# Patient Record
Sex: Female | Born: 1970 | Race: Black or African American | Hispanic: No | Marital: Single | Smoking: Never smoker
Health system: Southern US, Community
[De-identification: ages and names within clinical notes are randomized; demographics above are authoritative.]

## PROBLEM LIST (undated history)

## (undated) DIAGNOSIS — I10 Essential (primary) hypertension: Secondary | ICD-10-CM

## (undated) DIAGNOSIS — R87619 Unspecified abnormal cytological findings in specimens from cervix uteri: Secondary | ICD-10-CM

## (undated) DIAGNOSIS — N649 Disorder of breast, unspecified: Secondary | ICD-10-CM

## (undated) DIAGNOSIS — D649 Anemia, unspecified: Secondary | ICD-10-CM

## (undated) DIAGNOSIS — R51 Headache: Secondary | ICD-10-CM

## (undated) DIAGNOSIS — N83202 Unspecified ovarian cyst, left side: Secondary | ICD-10-CM

## (undated) DIAGNOSIS — Z862 Personal history of diseases of the blood and blood-forming organs and certain disorders involving the immune mechanism: Secondary | ICD-10-CM

## (undated) DIAGNOSIS — F329 Major depressive disorder, single episode, unspecified: Secondary | ICD-10-CM

## (undated) DIAGNOSIS — T7840XA Allergy, unspecified, initial encounter: Secondary | ICD-10-CM

## (undated) DIAGNOSIS — J310 Chronic rhinitis: Secondary | ICD-10-CM

## (undated) DIAGNOSIS — IMO0002 Reserved for concepts with insufficient information to code with codable children: Secondary | ICD-10-CM

## (undated) DIAGNOSIS — Z8669 Personal history of other diseases of the nervous system and sense organs: Secondary | ICD-10-CM

## (undated) DIAGNOSIS — Z8619 Personal history of other infectious and parasitic diseases: Secondary | ICD-10-CM

## (undated) DIAGNOSIS — D759 Disease of blood and blood-forming organs, unspecified: Secondary | ICD-10-CM

## (undated) DIAGNOSIS — O26899 Other specified pregnancy related conditions, unspecified trimester: Secondary | ICD-10-CM

## (undated) DIAGNOSIS — G4730 Sleep apnea, unspecified: Secondary | ICD-10-CM

## (undated) DIAGNOSIS — S42309A Unspecified fracture of shaft of humerus, unspecified arm, initial encounter for closed fracture: Secondary | ICD-10-CM

## (undated) DIAGNOSIS — I8390 Asymptomatic varicose veins of unspecified lower extremity: Secondary | ICD-10-CM

## (undated) DIAGNOSIS — Z9889 Other specified postprocedural states: Secondary | ICD-10-CM

## (undated) DIAGNOSIS — F32A Depression, unspecified: Secondary | ICD-10-CM

## (undated) DIAGNOSIS — B977 Papillomavirus as the cause of diseases classified elsewhere: Secondary | ICD-10-CM

## (undated) DIAGNOSIS — R102 Pelvic and perineal pain: Secondary | ICD-10-CM

## (undated) HISTORY — DX: Reserved for concepts with insufficient information to code with codable children: IMO0002

## (undated) HISTORY — DX: Major depressive disorder, single episode, unspecified: F32.9

## (undated) HISTORY — DX: Other specified postprocedural states: Z98.890

## (undated) HISTORY — DX: Pelvic and perineal pain: R10.2

## (undated) HISTORY — DX: Unspecified fracture of shaft of humerus, unspecified arm, initial encounter for closed fracture: S42.309A

## (undated) HISTORY — PX: HERNIA REPAIR: SHX51

## (undated) HISTORY — DX: Depression, unspecified: F32.A

## (undated) HISTORY — DX: Sleep apnea, unspecified: G47.30

## (undated) HISTORY — DX: Disorder of breast, unspecified: N64.9

## (undated) HISTORY — DX: Unspecified ovarian cyst, left side: N83.202

## (undated) HISTORY — DX: Other specified pregnancy related conditions, unspecified trimester: O26.899

## (undated) HISTORY — DX: Personal history of diseases of the blood and blood-forming organs and certain disorders involving the immune mechanism: Z86.2

## (undated) HISTORY — DX: Personal history of other infectious and parasitic diseases: Z86.19

## (undated) HISTORY — DX: Chronic rhinitis: J31.0

## (undated) HISTORY — DX: Personal history of other diseases of the nervous system and sense organs: Z86.69

## (undated) HISTORY — DX: Papillomavirus as the cause of diseases classified elsewhere: B97.7

## (undated) HISTORY — DX: Allergy, unspecified, initial encounter: T78.40XA

## (undated) HISTORY — DX: Asymptomatic varicose veins of unspecified lower extremity: I83.90

---

## 2005-04-05 ENCOUNTER — Emergency Department: Payer: Self-pay | Admitting: Emergency Medicine

## 2005-07-10 ENCOUNTER — Emergency Department: Payer: Self-pay | Admitting: Emergency Medicine

## 2006-09-25 DIAGNOSIS — K602 Anal fissure, unspecified: Secondary | ICD-10-CM | POA: Insufficient documentation

## 2006-09-25 HISTORY — PX: ANAL FISSURE REPAIR: SHX2312

## 2006-09-25 HISTORY — PX: EXCISIONAL HEMORRHOIDECTOMY: SHX1541

## 2010-05-18 ENCOUNTER — Emergency Department: Payer: Self-pay | Admitting: Emergency Medicine

## 2010-11-07 ENCOUNTER — Ambulatory Visit: Payer: Self-pay | Admitting: Internal Medicine

## 2010-11-07 DIAGNOSIS — R760 Raised antibody titer: Secondary | ICD-10-CM | POA: Insufficient documentation

## 2010-11-24 ENCOUNTER — Ambulatory Visit: Payer: Self-pay | Admitting: Internal Medicine

## 2010-12-25 ENCOUNTER — Ambulatory Visit: Payer: Self-pay | Admitting: Internal Medicine

## 2011-01-24 ENCOUNTER — Ambulatory Visit: Payer: Self-pay | Admitting: Internal Medicine

## 2011-02-24 ENCOUNTER — Ambulatory Visit: Payer: Self-pay | Admitting: Internal Medicine

## 2011-03-02 ENCOUNTER — Other Ambulatory Visit: Payer: Self-pay | Admitting: Obstetrics and Gynecology

## 2011-03-02 DIAGNOSIS — N644 Mastodynia: Secondary | ICD-10-CM

## 2011-03-09 ENCOUNTER — Ambulatory Visit
Admission: RE | Admit: 2011-03-09 | Discharge: 2011-03-09 | Disposition: A | Discharge status: Home / Self Care | Payer: BC Managed Care – PPO | Source: Ambulatory Visit | Attending: Obstetrics and Gynecology | Admitting: Obstetrics and Gynecology

## 2011-03-09 DIAGNOSIS — N644 Mastodynia: Secondary | ICD-10-CM

## 2011-03-10 ENCOUNTER — Other Ambulatory Visit: Payer: Self-pay

## 2011-03-13 DIAGNOSIS — Z9889 Other specified postprocedural states: Secondary | ICD-10-CM

## 2011-03-13 HISTORY — DX: Other specified postprocedural states: Z98.890

## 2011-03-26 DIAGNOSIS — S42309A Unspecified fracture of shaft of humerus, unspecified arm, initial encounter for closed fracture: Secondary | ICD-10-CM

## 2011-03-26 HISTORY — DX: Unspecified fracture of shaft of humerus, unspecified arm, initial encounter for closed fracture: S42.309A

## 2011-04-01 DIAGNOSIS — S42309A Unspecified fracture of shaft of humerus, unspecified arm, initial encounter for closed fracture: Secondary | ICD-10-CM | POA: Insufficient documentation

## 2011-04-02 ENCOUNTER — Emergency Department (HOSPITAL_COMMUNITY)
Admission: EM | Admit: 2011-04-02 | Discharge: 2011-04-02 | Disposition: A | Discharge status: Home / Self Care | Payer: BC Managed Care – PPO | Attending: Emergency Medicine | Admitting: Emergency Medicine

## 2011-04-02 ENCOUNTER — Emergency Department (HOSPITAL_COMMUNITY): Payer: BC Managed Care – PPO

## 2011-04-02 DIAGNOSIS — S42309A Unspecified fracture of shaft of humerus, unspecified arm, initial encounter for closed fracture: Secondary | ICD-10-CM | POA: Insufficient documentation

## 2011-04-02 DIAGNOSIS — Z4689 Encounter for fitting and adjustment of other specified devices: Secondary | ICD-10-CM | POA: Insufficient documentation

## 2011-04-02 DIAGNOSIS — M25519 Pain in unspecified shoulder: Secondary | ICD-10-CM | POA: Insufficient documentation

## 2011-04-07 ENCOUNTER — Encounter (HOSPITAL_COMMUNITY): Payer: Self-pay

## 2011-04-07 ENCOUNTER — Ambulatory Visit (HOSPITAL_COMMUNITY)
Admission: RE | Admit: 2011-04-07 | Discharge: 2011-04-07 | Disposition: A | Discharge status: Home / Self Care | Payer: BC Managed Care – PPO | Source: Ambulatory Visit | Attending: Obstetrics and Gynecology | Admitting: Obstetrics and Gynecology

## 2011-04-07 DIAGNOSIS — R760 Raised antibody titer: Secondary | ICD-10-CM

## 2011-04-07 DIAGNOSIS — S42301A Unspecified fracture of shaft of humerus, right arm, initial encounter for closed fracture: Secondary | ICD-10-CM

## 2011-04-07 DIAGNOSIS — D649 Anemia, unspecified: Secondary | ICD-10-CM | POA: Insufficient documentation

## 2011-04-07 DIAGNOSIS — O99011 Anemia complicating pregnancy, first trimester: Secondary | ICD-10-CM

## 2011-04-07 DIAGNOSIS — D759 Disease of blood and blood-forming organs, unspecified: Secondary | ICD-10-CM

## 2011-04-07 DIAGNOSIS — D691 Qualitative platelet defects: Secondary | ICD-10-CM | POA: Insufficient documentation

## 2011-04-07 HISTORY — DX: Disease of blood and blood-forming organs, unspecified: D75.9

## 2011-04-07 NOTE — Assessment & Plan Note (Signed)
Patient is making appointment with hematologist for further assessment of abnormal bleeding times and normal Von Willebrand factor.

## 2011-04-07 NOTE — Assessment & Plan Note (Signed)
Positive lupus anticoagulant study in February, not further assessed for anticardiolipin and b2 glycoprotein 1.  Should be assessed at next heme-onc appointment.

## 2011-04-07 NOTE — Progress Notes (Signed)
Desiree [name] [name] is Desiree [name] 41 y.o. female prima gravida to assess for recent lab abnormalities and need for management in pregnancy. Outside hematology reports reviewed; + lupus anticoagulant without sufficient documentation of anticadiolipin abnormality.  Also with prolonged bleeding times and normal Von Willebrand assessment.  Has not returned to specialist yet but plans revisit to further determine her hematological status.  No evidence to support lupus.  Family history includes stroke and venous thrombosis in her mother, both treated with anticoagulation.  Anemia is long standing and has apparently been corrected with replacement therapy  Current Outpatient Prescriptions  Medication Sig Dispense Refill  . IRON COMBINATIONS PO Take by mouth.        . Nutritional Supplements (VITAMIN D MAINTENANCE PO) Take by mouth.        . oxyCODONE-acetaminophen (PERCOCET) 10-325 MG per tablet Take 1 tablet by mouth every 4 (four) hours as needed.        . Prenatal MV-Min-Fe Fum-FA-DHA (PRENATAL 1 PO) Take by mouth.        . Probiotic Product (SOLUBLE FIBER/PROBIOTICS PO) Take by mouth.          Active Problems:  * No active hospital problems. *   Blood pressure 120/57, pulse 60, weight 138 lb (62.596 kg), last menstrual period 03/13/2011.  Review of Systems  Constitutional: Negative.   HENT: Negative.   Eyes: Negative for photophobia.  Respiratory: Negative.   Cardiovascular: Negative.   Gastrointestinal: Negative.   Genitourinary: Negative.   Musculoskeletal: Negative for joint pain.  Skin: Negative for rash.       bruising  Neurological: Negative.   Endo/Heme/Allergies: Bruises/bleeds easily.  Psychiatric/Behavioral: Negative.     Physical Exam  Vitals reviewed. Constitutional: She is oriented to person, place, and time. She appears well-developed and well-nourished.  Musculoskeletal: She exhibits no edema.  Neurological: She is alert and oriented to person, place, and time.  Skin: Skin is warm.      Assessment: see assessments and plans.  In absence of family history of known thrombophilias, acquired or inherited, and lack of features associated with antiphospholipid syndrome, Desiree [name] [name] needs further assessment of what may be Desiree [name] [name] with normal platelet count.  Despite Desiree [name] [name], she has not had any blood clots.  She is not presently Desiree [name] [name] for prophylactic anticoagulation, pending further studies.  Re-consultation after these are completed may be appropriate.   Time of face to face consultation: 30 minutes. Thank you for the opportunity to consult with Desiree [name] [name].  Desiree [name] [name],JOE 04/07/2011

## 2011-04-07 NOTE — Assessment & Plan Note (Signed)
Care with local orthopedist

## 2011-04-07 NOTE — Assessment & Plan Note (Signed)
Patient is receiving combination iron and Vitamin D for long-standing history of anemia associated with heavy periods and post surgical bleed, easy bruisability.

## 2011-04-14 ENCOUNTER — Ambulatory Visit: Payer: Self-pay | Admitting: Internal Medicine

## 2011-04-26 ENCOUNTER — Ambulatory Visit: Payer: Self-pay | Admitting: Internal Medicine

## 2011-05-04 LAB — ABO/RH: RH Type: POSITIVE

## 2011-05-04 LAB — GC/CHLAMYDIA PROBE AMP, GENITAL

## 2011-05-04 LAB — HIV ANTIBODY (ROUTINE TESTING W REFLEX): HIV: NONREACTIVE

## 2011-06-15 ENCOUNTER — Other Ambulatory Visit: Payer: Self-pay

## 2011-11-22 ENCOUNTER — Encounter (INDEPENDENT_AMBULATORY_CARE_PROVIDER_SITE_OTHER): Payer: BC Managed Care – PPO | Admitting: Obstetrics and Gynecology

## 2011-11-22 DIAGNOSIS — Z331 Pregnant state, incidental: Secondary | ICD-10-CM

## 2011-11-25 ENCOUNTER — Encounter (HOSPITAL_COMMUNITY): Payer: Self-pay

## 2011-11-25 ENCOUNTER — Inpatient Hospital Stay (HOSPITAL_COMMUNITY)
Admission: AD | Admit: 2011-11-25 | Discharge: 2011-11-27 | DRG: 373 | Disposition: A | Discharge status: Home / Self Care | Payer: BC Managed Care – PPO | Attending: Obstetrics and Gynecology | Admitting: Obstetrics and Gynecology

## 2011-11-25 ENCOUNTER — Telehealth: Payer: Self-pay | Admitting: Obstetrics and Gynecology

## 2011-11-25 DIAGNOSIS — O09519 Supervision of elderly primigravida, unspecified trimester: Secondary | ICD-10-CM | POA: Diagnosis present

## 2011-11-25 DIAGNOSIS — IMO0001 Reserved for inherently not codable concepts without codable children: Secondary | ICD-10-CM

## 2011-11-25 HISTORY — DX: Anemia, unspecified: D64.9

## 2011-11-25 HISTORY — DX: Headache: R51

## 2011-11-25 HISTORY — DX: Reserved for concepts with insufficient information to code with codable children: IMO0002

## 2011-11-25 HISTORY — DX: Unspecified abnormal cytological findings in specimens from cervix uteri: R87.619

## 2011-11-25 LAB — CBC
HCT: 38.2 % (ref 36.0–46.0)
Hemoglobin: 12.5 g/dL (ref 12.0–15.0)
MCH: 28 pg (ref 26.0–34.0)
MCHC: 32.7 g/dL (ref 30.0–36.0)
MCV: 85.5 fL (ref 78.0–100.0)
Platelets: 226 10*3/uL (ref 150–400)
RBC: 4.47 MIL/uL (ref 3.87–5.11)
RDW: 13.8 % (ref 11.5–15.5)
WBC: 7.6 10*3/uL (ref 4.0–10.5)

## 2011-11-25 MED ORDER — ACETAMINOPHEN 325 MG PO TABS: 650.0000 mg | ORAL_TABLET | ORAL | Status: DC | PRN

## 2011-11-25 MED ORDER — SODIUM CHLORIDE 0.9 % IJ SOLN: 3.0000 mL | INTRAMUSCULAR | Status: DC | PRN

## 2011-11-25 MED ORDER — BUTORPHANOL TARTRATE 2 MG/ML IJ SOLN: 1.0000 mg | INTRAMUSCULAR | Status: DC | PRN

## 2011-11-25 MED ORDER — IBUPROFEN 600 MG PO TABS
600.0000 mg | ORAL_TABLET | Freq: Four times a day (QID) | ORAL | Status: DC | PRN
  Administered 2011-11-25: 600 mg via ORAL
  Filled 2011-11-25: qty 1

## 2011-11-25 MED ORDER — MISOPROSTOL 200 MCG PO TABS
ORAL_TABLET | ORAL | Status: AC
  Administered 2011-11-25: 800 ug via VAGINAL
  Filled 2011-11-25: qty 4

## 2011-11-25 MED ORDER — OXYCODONE-ACETAMINOPHEN 5-325 MG PO TABS: 1.0000 | ORAL_TABLET | ORAL | Status: DC | PRN

## 2011-11-25 MED ORDER — CITRIC ACID-SODIUM CITRATE 334-500 MG/5ML PO SOLN: 30.0000 mL | ORAL | Status: DC | PRN

## 2011-11-25 MED ORDER — SODIUM CHLORIDE 0.9 % IV SOLN: 250.0000 mL | INTRAVENOUS | Status: DC | PRN

## 2011-11-25 MED ORDER — FLEET ENEMA 7-19 GM/118ML RE ENEM: 1.0000 | ENEMA | RECTAL | Status: DC | PRN

## 2011-11-25 MED ORDER — OXYTOCIN 10 UNIT/ML IJ SOLN
INTRAMUSCULAR | Status: AC
  Administered 2011-11-25: 20 [IU] via INTRAMUSCULAR
  Filled 2011-11-25: qty 2

## 2011-11-25 MED ORDER — LIDOCAINE HCL (PF) 1 % IJ SOLN
30.0000 mL | INTRAMUSCULAR | Status: DC | PRN
  Administered 2011-11-25: 30 mL via SUBCUTANEOUS
  Filled 2011-11-25: qty 30

## 2011-11-25 MED ORDER — ONDANSETRON HCL 4 MG/2ML IJ SOLN: 4.0000 mg | Freq: Four times a day (QID) | INTRAMUSCULAR | Status: DC | PRN

## 2011-11-25 MED ORDER — OXYTOCIN BOLUS FROM INFUSION
500.0000 mL | Freq: Once | INTRAVENOUS | Status: DC
  Filled 2011-11-25: qty 500
  Filled 2011-11-25: qty 1000

## 2011-11-25 MED ORDER — OXYTOCIN 20 UNITS IN LACTATED RINGERS INFUSION - SIMPLE: 125.0000 mL/h | Freq: Once | INTRAVENOUS | Status: DC

## 2011-11-25 MED ORDER — SODIUM CHLORIDE 0.9 % IJ SOLN: 3.0000 mL | Freq: Two times a day (BID) | INTRAMUSCULAR | Status: DC

## 2011-11-25 MED ORDER — LACTATED RINGERS IV SOLN: 500.0000 mL | INTRAVENOUS | Status: DC | PRN

## 2011-11-25 NOTE — Progress Notes (Signed)
SVD of viable female 

## 2011-11-25 NOTE — Plan of Care (Signed)
Problem: Consults Goal: Birthing Suites Patient Information Press F2 to bring up selections list   Pt > [redacted] weeks EGA     

## 2011-11-25 NOTE — Progress Notes (Signed)
Pt assisted to all fours in bed, but finds it uncomfortable. Pt asked about bathtub. Offered shower and offered an empty room to use tub-pt indecisive at this point due to pain.

## 2011-11-25 NOTE — Progress Notes (Signed)
Desiree [name] is a 41 y.o. G1P0000 at [redacted]w[redacted]d  admitted for active labor  Subjective: Pt continues to change positions frequently and ambulate intermittently.  She continues to do well with UCs.  She states she is coping well but having increased pelvic and vaginal pressure with UCs.  Has urge to push with UCs.  Family members remain at bedside and supporting pt.    Objective: BP 131/74  Pulse 79  Temp(Src) 98.6 F (37 C) (Oral)  Resp 20  Ht 5\' 3"  (1.6 m)  Wt 77.565 kg (171 lb)  BMI 30.29 kg/m2  LMP 03/13/2011 I/O last 3 completed shifts: In: -  Out: 650 [Emesis/NG output:650]   FHT:  FHR: 130 bpm, variability: moderate,  accelerations:  Present,  decelerations:  Absent UC:   regular, every 1-2 minutes SVE:   Dilation: 10 Effacement (%): 100 Station: +2 Exam by:: Elsie Ra CNM  Labs: Lab Results  Component Value Date   WBC 7.6 11/25/2011   HGB 12.5 11/25/2011   HCT 38.2 11/25/2011   MCV 85.5 11/25/2011   PLT 226 11/25/2011    Assessment / Plan: Spontaneous labor, progressing normally  Labor: Progressing normally Preeclampsia:  no signs or symptoms of toxicity Fetal Wellbeing:  Category I Pain Control:  Labor support without medications I/D:  n/a Anticipated MOD:  NSVD  Pt to begin pushing with urge.  Continue current plan and observe carefully.    Davita Sublett O. 11/25/2011, 8:34 PM

## 2011-11-26 LAB — CBC
Hemoglobin: 11.5 g/dL — ABNORMAL LOW (ref 12.0–15.0)
MCH: 27.7 pg (ref 26.0–34.0)
Platelets: 220 10*3/uL (ref 150–400)
RBC: 4.15 MIL/uL (ref 3.87–5.11)
WBC: 18.2 10*3/uL — ABNORMAL HIGH (ref 4.0–10.5)

## 2011-11-26 MED ORDER — ONDANSETRON HCL 4 MG PO TABS: 4.0000 mg | ORAL_TABLET | ORAL | Status: DC | PRN

## 2011-11-26 MED ORDER — SENNOSIDES-DOCUSATE SODIUM 8.6-50 MG PO TABS
2.0000 | ORAL_TABLET | Freq: Every day | ORAL | Status: DC
Start: 2011-11-26 — End: 2011-11-27
  Administered 2011-11-26: 2 via ORAL

## 2011-11-26 MED ORDER — ZOLPIDEM TARTRATE 5 MG PO TABS: 5.0000 mg | ORAL_TABLET | Freq: Every evening | ORAL | Status: DC | PRN

## 2011-11-26 MED ORDER — SIMETHICONE 80 MG PO CHEW: 80.0000 mg | CHEWABLE_TABLET | ORAL | Status: DC | PRN

## 2011-11-26 MED ORDER — BENZOCAINE-MENTHOL 20-0.5 % EX AERO
INHALATION_SPRAY | CUTANEOUS | Status: AC
  Filled 2011-11-26: qty 56

## 2011-11-26 MED ORDER — DIPHENHYDRAMINE HCL 25 MG PO CAPS: 25.0000 mg | ORAL_CAPSULE | Freq: Four times a day (QID) | ORAL | Status: DC | PRN

## 2011-11-26 MED ORDER — WITCH HAZEL-GLYCERIN EX PADS: 1.0000 "application " | MEDICATED_PAD | CUTANEOUS | Status: DC | PRN

## 2011-11-26 MED ORDER — OXYCODONE-ACETAMINOPHEN 5-325 MG PO TABS: 1.0000 | ORAL_TABLET | ORAL | Status: DC | PRN

## 2011-11-26 MED ORDER — FERROUS SULFATE 325 (65 FE) MG PO TABS
325.0000 mg | ORAL_TABLET | Freq: Two times a day (BID) | ORAL | Status: DC
  Administered 2011-11-26 – 2011-11-27 (×3): 325 mg via ORAL
  Filled 2011-11-26 (×3): qty 1

## 2011-11-26 MED ORDER — TETANUS-DIPHTH-ACELL PERTUSSIS 5-2.5-18.5 LF-MCG/0.5 IM SUSP
0.5000 mL | Freq: Once | INTRAMUSCULAR | Status: AC
  Administered 2011-11-26: 0.5 mL via INTRAMUSCULAR
  Filled 2011-11-26: qty 0.5

## 2011-11-26 MED ORDER — BENZOCAINE-MENTHOL 20-0.5 % EX AERO
1.0000 "application " | INHALATION_SPRAY | CUTANEOUS | Status: DC | PRN
  Administered 2011-11-26: 1 via TOPICAL

## 2011-11-26 MED ORDER — DIBUCAINE 1 % RE OINT: 1.0000 "application " | TOPICAL_OINTMENT | RECTAL | Status: DC | PRN

## 2011-11-26 MED ORDER — LANOLIN HYDROUS EX OINT: TOPICAL_OINTMENT | CUTANEOUS | Status: DC | PRN

## 2011-11-26 MED ORDER — IBUPROFEN 600 MG PO TABS
600.0000 mg | ORAL_TABLET | Freq: Four times a day (QID) | ORAL | Status: DC
  Administered 2011-11-26 – 2011-11-27 (×6): 600 mg via ORAL
  Filled 2011-11-26 (×6): qty 1

## 2011-11-26 MED ORDER — ONDANSETRON HCL 4 MG/2ML IJ SOLN: 4.0000 mg | INTRAMUSCULAR | Status: DC | PRN

## 2011-11-26 MED ORDER — PRENATAL MULTIVITAMIN CH
1.0000 | ORAL_TABLET | Freq: Every day | ORAL | Status: DC
  Administered 2011-11-26 – 2011-11-27 (×2): 1 via ORAL
  Filled 2011-11-26 (×2): qty 1

## 2011-11-26 NOTE — Progress Notes (Signed)
Transferred to room 109 via wheelchair wit hfamily at side

## 2011-11-26 NOTE — Progress Notes (Signed)
Delivery Note Pt pushed effectively with UCs.  At 9:34 PM a viable female was delivered via Vaginal, Spontaneous Delivery (Presentation: Right Occiput Anterior). No nuchal cord noted.  No difficulty with shoulders.  Infant with spontaneous lusty cry.  Infant dried, stimulated and placed on maternal abdomen.  Cord doubly clamped.  Cord cut by pt's mother at bedside.     APGAR: 9, ; weight 8 lb 2.3 oz (3695 g).   Placenta status: Intact, Spontaneous.  Cord: 3 vessels with the following complications: None.  Cord pH: Not obtained  Anesthesia: 1% Xylocaine for laceration repair  Episiotomy: None Lacerations: 2nd degree;Perineal Suture Repair: 3.0 vicryl Est. Blood Loss (mL): 450  Mom to postpartum.  Baby to nursery-stable.  Keshawna Dix O. 11/26/2011, 12:53 PM
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
## 2011-11-26 NOTE — Progress Notes (Addendum)
Desiree [name] is a 41 y.o. G1P1001 at [redacted]w[redacted]d admitted for active labor  Subjective: Pt reports increased intensity and frequency of contractions.  She continues to ambulate in room.  Family members present for support.  Pt states she is coping without difficulty and desires to continue with the same management.  She does report some increased vaginal pressure and requests SVE.    Objective: Blood pressure 133/74; pulse 99; resp 24; temperature 97.9. Pt breathing and swaying with contractions.  Coping well.    FHT:  FHR: 130 bpm, variability: moderate,  accelerations:  Present,  decelerations:  Absent UC:   regular, every 1-2 minutes SVE:   Dilation: 7cm Effacement (%) 80 Station: -3 Exam by: Elsie Ra, CNM     SROM after exam with moderate amt light meconium stained amniotic fluid noted.       Assessment / Plan: Spontaneous labor, progressing normally Meconium stained amniotic fluid  Labor: Progressing normally Preeclampsia:  no signs or symptoms of toxicity Fetal Wellbeing:  Category I Pain Control:  Labor support without medications I/D:  n/a Anticipated MOD:  NSVD  Continue current plan of care.   Doryan Bahl O. 11/26/2011, 12:27 PM

## 2011-11-26 NOTE — Progress Notes (Addendum)
Sierah Lacewell is a 41 y.o. G1P1001 at [redacted]w[redacted]d admitted for active labor   Subjective:  Pt reports increased intensity and frequency of contractions. She continues to ambulate in room. Family members present for support. Pt states she is coping without difficulty and desires to continue with the same management. She does report some increased vaginal pressure and requests SVE.   Objective:  Blood pressure 131/74; pulse 79; resp 24; temperature 98.6 Pt breathing and swaying with contractions. Coping well.  FHT: FHR: 135 bpm, variability: moderate, accelerations: Present, decelerations: Absent  UC: regular, every 1-2 minutes   SVE: Dilation: 8 to 9cm Effacement (%) 100 Station: -2 Exam by: Elsie Ra, CNM  Continues to leak small amts of light to mod meconium stained amniotic fluid.  Assessment / Plan:  Spontaneous labor, progressing normally   Labor: Progressing normally  Preeclampsia: no signs or symptoms of toxicity  Fetal Wellbeing: Category I  Pain Control: Labor support without medications  I/D: n/a  Anticipated MOD: NSVD   Continue current plan of care.

## 2011-11-26 NOTE — H&P (Signed)
Desiree [name] is a 41 y.o. female presenting for complaints of regular uterine contractions with increased frequency and intensity since 0500.  Denies ROM or bldg.  Reports normal fetal activity.    Maternal Medical History:  Reason for admission: Reason for admission: contractions.  Contractions: Onset was 6-12 hours ago.   Frequency: regular.   Perceived severity is strong.    Fetal activity: Perceived fetal activity is normal.   Last perceived fetal movement was within the past hour.    Prenatal complications: no prenatal complications Prenatal Complications - Diabetes: none.   Hx Present Pregnancy: Pt entered care at [redacted]wks gestation.  She was followed by the MD service at [facility].  Pt's hx significant for fx humerus during the 1st trimester with xray exposure as well as medications.  Fx followed by ortho and healed without issues. Pt had neg 1st trimester screening.  Pt was referred to [facility] due to reported hx of prolonged bleeding time and subsequent testing for coagulopathy was found to be neg.  Pt was also evaluated by MFM due to lab result of positive Lupus anticoagulant.  No anticoagulation was found to be necessary by MFM.  Pt underwent US for anatomy at 20wks.  Fetal growth was followed by [place] at 24 and 27wks.  The remainder of pt's prenatal course was uncomplicated.    OB History    Grav Para Term Preterm Abortions TAB SAB Ect Mult Living   1 1 1  0 0 0 0 0 0 1     Past Medical History  Diagnosis Date  . 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 Anemia   . Headache   . Abnormal Pap smear    Past Surgical History  Procedure Date  . Excisional hemorrhoidectomy 2008    Bleeding complication  . No past surgeries    Family History: family history includes Deep vein thrombosis in her mother; Lupus in her cousin; and Stroke in her mother. Social History:  reports that she has never smoked. She has never used smokeless tobacco. She  reports that she does not drink alcohol or use illicit drugs.  Review of Systems  Constitutional: Negative.   HENT: Negative.   Eyes: Negative.   Respiratory: Negative.   Cardiovascular: Negative.   Gastrointestinal: Negative.   Genitourinary: Negative.   Musculoskeletal: Negative.   Skin: Negative.   Neurological: Negative.   Endo/Heme/Allergies: Negative.   Psychiatric/Behavioral: Negative.    Dilation: 4-5cm Effacement %: 90 Station: -3; vertex, BBOW Exam by: N. Katrinka Blazing, CNM Blood pressure 133/77; pulse 92; resp 16; Height 5 ft 3 in; Weight 77.565kg  FHR baseline 130.  Moderate variability noted.  Accels present.  Decels absent.  FHR Cat I UC's every 3-4 minutes; 60-70 secs duration; mod to strong to palpation  Maternal Exam:  Uterine Assessment: Contraction strength is firm.  Contraction frequency is regular.   Abdomen: Patient reports no abdominal tenderness. Fundal height is 40.   Estimated fetal weight is 8.5.   Fetal presentation: vertex  Introitus: Normal vulva. Normal vagina.  Ferning test: not done.  Nitrazine test: not done. Amniotic fluid character: not assessed.  Pelvis: adequate for delivery.   Cervix: Cervix evaluated by digital exam.     Physical Exam  Constitutional: She is oriented to person, place, and time. She appears well-developed and well-nourished.  HENT:  Head: Normocephalic and atraumatic.  Right Ear: External ear normal.  Left Ear: External ear normal.  Nose:  Nose normal.  Eyes: Conjunctivae are normal. Pupils are equal, round, and reactive to light.  Neck: Normal range of motion. Neck supple. No thyromegaly present.  Cardiovascular: Normal rate, regular rhythm and intact distal pulses.   Respiratory: Effort normal and breath sounds normal.  GI: Soft. Bowel sounds are normal.  Genitourinary: Vagina normal and uterus normal.  Musculoskeletal: Normal range of motion. She exhibits edema.       Tr edema noted lower extrems.  Neurological:  She is alert and oriented to person, place, and time. She has normal reflexes.  Skin: Skin is warm and dry.  Psychiatric: She has a normal mood and affect. Her behavior is normal.    Prenatal labs: ABO, Rh: O/Positive/-- (08/09 0000) Antibody: Negative (08/09 0000) Rubella: Immune (08/09 0000) RPR: NON REACTIVE (03/02 1440)  HBsAg: Negative (08/09 0000)  HIV: Non-reactive (08/09 0000)  GBS: Negative (02/04 0000)   Assessment/Plan: IUP at 40w 0d Active labor Advanced maternal age  Admit to [facility] per consult with Dr. Stefano Gaul. Routine admission orders Pt desires non-interventive birth and will allow pt to ambulate and monitor FHR intermittently at present.    SMITH,NONA O. 11/26/2011, 10:55 AM

## 2011-11-27 MED ORDER — IBUPROFEN 600 MG PO TABS: 600.0000 mg | ORAL_TABLET | Freq: Four times a day (QID) | ORAL | Status: AC | PRN

## 2011-11-27 NOTE — Discharge Instructions (Signed)

## 2011-11-27 NOTE — Progress Notes (Signed)
Post Partum Day 1 Subjective: no complaints, up ad lib, voiding, tolerating PO, + flatus and working on Breastfeeding.  Plan IP circ, but questions r/e "cyst-like" areas x2 on head of newborn's penis.  Does report some lilght-headedness when up since delivery.  Several family members at bedside and supportive.  VB lighter today.  Objective: Blood pressure 120/78, pulse 91, temperature 98.4 F (36.9 C), temperature source Oral, resp. rate 20, height 5\' 3"  (1.6 m), weight 77.565 kg (171 lb), last menstrual period 03/13/2011, SpO2 99.00%, unknown if currently breastfeeding.  Physical Exam:  General: alert, cooperative and no distress Lochia: appropriate, rubra Uterine Fundus: firm, below umbilicus Incision: n/a DVT Evaluation: No evidence of DVT seen on physical exam. Negative Homan's sign. No significant calf/ankle edema.   Basename 11/26/11 0500 11/25/11 1440  HGB 11.5* 12.5  HCT 35.1* 38.2    Assessment/Plan: Plan for discharge tomorrow, Breastfeeding, Lactation consult and Circumcision prior to discharge Referred questions r/e newborn's penile exam to her pediatrician.  Rec'd slow position changes and adequate po hydration.    LOS: 2 days   Desiree [name] H 11/27/2011, 8:29 AM

## 2011-11-27 NOTE — Discharge Summary (Signed)
Obstetric Discharge Summary Reason for Admission: onset of labor Prenatal Procedures: NST and ultrasound Intrapartum Procedures: spontaneous vaginal delivery Postpartum Procedures: none Complications-Operative and Postpartum: 2nd degree perineal laceration History remarkable for + ANA Hemoglobin  Date Value Range Status  11/26/2011 11.5* 12.0-15.0 (g/dL) Final     HCT  Date Value Range Status  11/26/2011 35.1* 36.0-46.0 (%) Final   Hospital Course:   Admitted 11/25/11.  Negative GBS. Progressed in labor without pain medication. Delivery was performed by Elsie Ra, CNM, without complication. Patient and baby tolerated the procedure without difficulty, with  2nd degree laceration noted. Infant to FTN. Mother and infant then had an uncomplicated postpartum course, with breast feeding going slowly. Mom's physical exam was WNL, and she was discharged home in stable condition. She did have a possible periumbilical hernia noted on the day of discharge.   An abdominal binder was provided for the patient per her request and comfort.  Plans were made to continue to observe that issue, and for her to call with any increase in symptoms.  Contraception plan was Depo Provera at 6 weeks.  She received adequate benefit from po pain medications, and was discharged home with Motrin.  Baby was noted to have ? Sebaceous glands that were noticeable on the penis, with possible hypospadius.  They have been referred to the urologist for further evaluation, therefore no circumcision will occur in the hospital at this time.      Discharge Diagnoses: Term Pregnancy-delivered, Hx + ANA titer  Discharge Information: Date: 11/27/2011 Activity: Per [facility] handout Diet: routine Medications: Ibuprofen Condition: stable Instructions: refer to practice specific booklet Discharge to: home  Follow-up Information    Follow up with [facility] in 6 weeks. (Get DepoProvera prescription filled before coming for  postpartum visit and bring it with you to your 6 weeks visit.)          Newborn Data: Live born female  Birth Weight: 8 lb 2.3 oz (3695 g) APGAR: 9, 5 minute Apgar not documented  Home with mother.  Desiree [name] 11/27/2011, 8:25 AM

## 2011-11-29 ENCOUNTER — Encounter: Payer: BC Managed Care – PPO | Admitting: Obstetrics and Gynecology

## 2011-11-29 ENCOUNTER — Other Ambulatory Visit: Payer: BC Managed Care – PPO

## 2011-11-29 ENCOUNTER — Ambulatory Visit (HOSPITAL_COMMUNITY)
Admission: RE | Admit: 2011-11-29 | Discharge: 2011-11-29 | Disposition: A | Discharge status: Home / Self Care | Payer: BC Managed Care – PPO | Source: Ambulatory Visit | Attending: Obstetrics and Gynecology | Admitting: Obstetrics and Gynecology

## 2011-11-29 NOTE — Progress Notes (Addendum)
Adult Lactation Consultation Outpatient Visit Note  Patient Name: Desiree [name] Date of Birth: 1971-03-12 Gestational Age at Delivery: Unknown Type of Delivery: Vaginal del on 3/49 ( 13 days old baby boy "Jermiah" , )  Per mom reason for [facility] visit today is both nipples are sore , and Jermiah isn't latching well . He's receiving a bottle ( formula ) , last feeding at 11am ( 40 ml ) Similac . And has been pumping with a manual pump without much yield . Soreness still persists  Birth weight =8-2oz, D/C weight 7-12oz on 3/4  8-4oz at Dr. Chelsea Primus office today .   Breastfeeding History: Frequency of Breastfeeding:  Length of Feeding:  Voids: >6 Stools:2-3 yellowish stools    Supplementing / Method: Pumping:  Type of Pump:Manual ( Medela ) from the hospital    Frequency:  Volume:    Comments: LC's assessment - noted both breast filling , some bruising on both nipples , and areolas bilaterally semi compress able and swollen.  Infants clothes removed for feeding assessment , few sucks ,unable to obtain depth  at the breast , applied #24 nipple shield and infant latched well with depth ,took a few sucks and unlatched . Infant sluggish ,very relaxed and not showing signs of hunger .         While mom held infant skin to skin . LC wrote a plan for sore nipple tx , and transitioning back to the breast . After about 1/2 hour infant woke up ,LC applied a nipple shield with and SNS with formula on top and infant fed 20 miins and took 25ml  of formula , still seemed hungry so LC added 15 ml more of the formula in the SNS . Infant relaxed , seemed satisfied .     Mom ready to leave and infant started rooting sucking on his hands and mom fed 15 ml more from a bottle . Infant then seemed satisfied . ,     Consultation Evaluation:  Initial Feeding Assessment: Pre-feed Weight:8.2.5oz( 3700g)  Post-feed [id number]  Amount Transferred:79ml  Comments: Infant was able to sustain the latch with the nipple shield  and sucked down 40 ml form the SNS . Mom also was very comfortable  with latch .   Additional Feeding Assessment: Pre-feed Weight: Post-feed Weight: Amount Transferred: Comments:  Additional Feeding Assessment: Pre-feed Weight: Post-feed Weight: Amount Transferred: Comments:  Total Breast milk Transferred this Visit: scant am't in the nipple shield and with hand expression .  Total Supplement Given: 55ml ( combination of SNS and bottle   Additional Interventions: Discussed with mom supply and demand and the importance of transitioning back to the breast . The Lactation plan for home is for transitioning back to the breast and tx of sore nipples . 1) Encouraged mom to get some rest , drink to thirst ( H20) , nutritious snacks and meals , 2) Feedings ,every 2-3 hours and on demand 3) Steps for latching due to soreness - breast massage , hand express , pre pump either with hand pump or DEBP to enhance flow and to make the nipple and areola more elastic for a deeper latch . , then apply nipple shield and SNS on top . ( supplement with 30-49ml until milk comes in ) , once milk comes in switch to using breast milk ( Goal is to Transition infant back to breast and eventually stop using the SNS and Nipple shield . 4) post pump for 10 mins after feedings due  to the use of the nipple shield ,and if not latching pump for 15-20 mins to establish and protect milk supply .              Mom plans to borrow a DEBP Medela pump form a friend ( DEBP kit given with instruction , also the SNS kit , and shells .  Mom also able to apply nipple shield correctly and independently    Follow-Up- Mom was made aware when using a nipple shield weekly weight checks are recommended .       Kathrin Greathouse 11/29/2011, 3:45 PM

## 2011-11-30 ENCOUNTER — Encounter (INDEPENDENT_AMBULATORY_CARE_PROVIDER_SITE_OTHER): Payer: BC Managed Care – PPO | Admitting: Registered Nurse

## 2011-11-30 DIAGNOSIS — D259 Leiomyoma of uterus, unspecified: Secondary | ICD-10-CM

## 2011-12-25 ENCOUNTER — Other Ambulatory Visit: Payer: Self-pay | Admitting: Obstetrics and Gynecology

## 2011-12-25 DIAGNOSIS — D259 Leiomyoma of uterus, unspecified: Secondary | ICD-10-CM

## 2011-12-29 ENCOUNTER — Telehealth: Payer: Self-pay | Admitting: Obstetrics and Gynecology

## 2011-12-29 NOTE — Telephone Encounter (Signed)
TC from patient--5 weeks pp. Had N/V today, but now keeping food and crackers down. No diarrhea. Temp 99--100. Took single regular Tylenol at 4 pm--kept it down. No other contacts sick. No other symptoms.   Breast feeding.  Likely viral illness, most commonly self-limiting Recommended Tylenol ATC, push fluids, maintain clear liquid diet until nausea resolved. Doesn't feel needs anti-emetic at present. Will continue to observe, will call with any worsening of symptoms. Has pp exam next week. Recommended good handwashing and hygiene measures.  Nigel Bridgeman, CNM, [place] 12/28/11 10p

## 2012-01-03 ENCOUNTER — Other Ambulatory Visit: Payer: BC Managed Care – PPO

## 2012-01-03 ENCOUNTER — Ambulatory Visit (INDEPENDENT_AMBULATORY_CARE_PROVIDER_SITE_OTHER): Payer: BC Managed Care – PPO | Admitting: Obstetrics and Gynecology

## 2012-01-03 ENCOUNTER — Other Ambulatory Visit: Payer: Self-pay | Admitting: Obstetrics and Gynecology

## 2012-01-03 ENCOUNTER — Ambulatory Visit (INDEPENDENT_AMBULATORY_CARE_PROVIDER_SITE_OTHER): Payer: BC Managed Care – PPO

## 2012-01-03 ENCOUNTER — Ambulatory Visit: Payer: BC Managed Care – PPO | Admitting: Obstetrics and Gynecology

## 2012-01-03 ENCOUNTER — Encounter: Payer: Self-pay | Admitting: Obstetrics and Gynecology

## 2012-01-03 VITALS — BP 120/78 | HR 66 | Temp 98.7°F | Resp 14 | Wt 142.0 lb

## 2012-01-03 DIAGNOSIS — R87612 Low grade squamous intraepithelial lesion on cytologic smear of cervix (LGSIL): Secondary | ICD-10-CM

## 2012-01-03 DIAGNOSIS — D259 Leiomyoma of uterus, unspecified: Secondary | ICD-10-CM

## 2012-01-03 DIAGNOSIS — R760 Raised antibody titer: Secondary | ICD-10-CM

## 2012-01-03 DIAGNOSIS — R894 Abnormal immunological findings in specimens from other organs, systems and tissues: Secondary | ICD-10-CM

## 2012-01-03 DIAGNOSIS — R87622 Low grade squamous intraepithelial lesion on cytologic smear of vagina (LGSIL): Secondary | ICD-10-CM | POA: Insufficient documentation

## 2012-01-03 DIAGNOSIS — IMO0002 Reserved for concepts with insufficient information to code with codable children: Secondary | ICD-10-CM

## 2012-01-03 MED ORDER — NORELGESTROMIN-ETH ESTRADIOL 150-35 MCG/24HR TD PTWK: 1.0000 | MEDICATED_PATCH | TRANSDERMAL | Status: DC

## 2012-01-03 NOTE — Patient Instructions (Signed)
Patient Education Materials to be provided at check out (*indicates is located in Garment/textile technologist):  Urinary Incontinence

## 2012-01-03 NOTE — Progress Notes (Signed)
Subjective:     Desiree [name] is a 41 y.o. female who presents for a postpartum visit.   She is 6 weeks postpartum following a spontaneous vaginal delivery at term.  She is currently bottle feeding.  She complains of urinary incontinence on occasision.  The abdominal pain for which she was evaluated on 11/30/11 is resolved. I have fully reviewed the prenatal and intrapartum course.     Postpartum course has been marked with mild abdominal pain. Baby's course has been normal. Baby is feeding by bottle - Similac Sensitive RS.  Bleeding no bleeding.  Bowel function is normal.  Bladder function is some post void leaking.  Patient is not sexually active.  Contraception method is Ship broker weekly.  Postpartum depression screening: "negative"::EPDS=10.  Discussed with patient who is feeling much better each day  The following portions of the patient's history were reviewed and updated as appropriate: allergies, current medications, past medical history and problem list.  Review of Systems Pertinent items are noted in HPI.   Objective:    BP 120/78  Pulse 66  Temp 98.7 F (37.1 C)  Resp 14  Wt 142 lb (64.411 kg)  Breastfeeding? No  General:  alert, cooperative and no distress   Breasts:  inspection negative, no nipple discharge or bleeding, no masses or nodularity palpable  Lungs: clear to auscultation bilaterally  Heart:  regular rate and rhythm, S1, S2 normal, no murmur  Abdomen: soft, non-tender; bowel sounds normal; no masses,  no organomegaly   Vulva:  normal  Vagina: normal vagina  Cervix:  normal  Corpus: normal size, contour, position, consistency, mobility, non-tender  Adnexa:  normal adnexa  Rectal Exam: Not performed      ULTRASOUND: Anteverted uterus.  Normal myometrium  Thin endometrium 3.91mm  No findings consistent with POCs    Assessment:     Normal postpartum exam. 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 Pap smear not done  at today's visit. Due 7/13  Plan:     1. Contraception: Ortho-Evra patches weekly 3. Follow up in: 7/13  or as needed.    Oanh Devivo P MD 01/03/2012 3:50 PM

## 2012-01-03 NOTE — Progress Notes (Signed)
[name], female   DOB: 05/31/1971, 41 y.o.   MRN: [id number] Date of delivery: 11-25-2011 Female Name: Jeri Modena Vaginal delivery:yes Cesarean section:no Tubal ligation:no GDM:no Breast Feeding:no Bottle Feeding:no Post-Partum Blues:no Abnormal pap:no Normal GU function: yes Normal GI function:yes Returning to work:yes  Pt is considering Birth Control

## 2012-01-05 ENCOUNTER — Other Ambulatory Visit: Payer: BC Managed Care – PPO

## 2012-01-05 ENCOUNTER — Encounter: Payer: Self-pay | Admitting: Obstetrics and Gynecology

## 2012-01-08 NOTE — Telephone Encounter (Signed)
See note

## 2012-01-29 ENCOUNTER — Telehealth: Payer: Self-pay | Admitting: Obstetrics and Gynecology

## 2012-01-29 NOTE — Telephone Encounter (Signed)
This is [facility] pt 

## 2012-01-30 ENCOUNTER — Encounter: Payer: Self-pay | Admitting: Obstetrics and Gynecology

## 2012-01-30 ENCOUNTER — Telehealth: Payer: Self-pay | Admitting: Obstetrics and Gynecology

## 2012-01-30 NOTE — Telephone Encounter (Signed)
RTW note faxed per telephone message req by Lowell Bouton. Pt aware

## 2012-01-30 NOTE — Telephone Encounter (Signed)
Triage/electronic 

## 2012-04-01 ENCOUNTER — Encounter: Payer: Self-pay | Admitting: Obstetrics and Gynecology

## 2012-04-01 ENCOUNTER — Ambulatory Visit (INDEPENDENT_AMBULATORY_CARE_PROVIDER_SITE_OTHER): Payer: BC Managed Care – PPO | Admitting: Obstetrics and Gynecology

## 2012-04-01 VITALS — BP 104/60 | Ht 63.0 in | Wt 138.0 lb

## 2012-04-01 DIAGNOSIS — Z124 Encounter for screening for malignant neoplasm of cervix: Secondary | ICD-10-CM

## 2012-04-01 DIAGNOSIS — IMO0002 Reserved for concepts with insufficient information to code with codable children: Secondary | ICD-10-CM | POA: Insufficient documentation

## 2012-04-01 DIAGNOSIS — R51 Headache: Secondary | ICD-10-CM

## 2012-04-01 DIAGNOSIS — R87612 Low grade squamous intraepithelial lesion on cytologic smear of cervix (LGSIL): Secondary | ICD-10-CM

## 2012-04-01 DIAGNOSIS — K469 Unspecified abdominal hernia without obstruction or gangrene: Secondary | ICD-10-CM | POA: Insufficient documentation

## 2012-04-01 NOTE — Progress Notes (Signed)
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 It was recommended the patient followup with a neurologist however and a neurologist in [place] would not be able to see her until September  BP 104/60  Ht 5\' 3"  (1.6 m)  Wt 138 lb (62.596 kg)  BMI 24.45 kg/m2  LMP 03/31/2012  Breastfeeding? Unknown  Pelvic exam: normal external genitalia, vulva, vagina, cervix, uterus and adnexa Except for a uterus that is slightly upper limits of normal size.  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 If normal repeat in 6 months. If abnormal we'll repeat colposcopy

## 2012-04-03 LAB — PAP IG W/ RFLX HPV ASCU

## 2012-08-27 ENCOUNTER — Ambulatory Visit: Payer: Self-pay | Admitting: Emergency Medicine

## 2012-09-26 ENCOUNTER — Telehealth: Payer: Self-pay | Admitting: Obstetrics and Gynecology

## 2012-09-26 NOTE — Telephone Encounter (Signed)
TC to pt to advise that the last flu vaccine was 06/14/11 Pt voiced understanding  Darien Ramus, CMA

## 2013-05-21 LAB — HM PAP SMEAR: HM Pap smear: NORMAL

## 2014-05-12 ENCOUNTER — Other Ambulatory Visit: Payer: Self-pay | Admitting: Obstetrics and Gynecology

## 2014-05-12 DIAGNOSIS — Z1231 Encounter for screening mammogram for malignant neoplasm of breast: Secondary | ICD-10-CM

## 2014-05-20 ENCOUNTER — Ambulatory Visit: Payer: BC Managed Care – PPO

## 2014-05-21 ENCOUNTER — Ambulatory Visit
Admission: RE | Admit: 2014-05-21 | Discharge: 2014-05-21 | Disposition: A | Discharge status: Home / Self Care | Payer: BC Managed Care – PPO | Source: Ambulatory Visit | Attending: Obstetrics and Gynecology | Admitting: Obstetrics and Gynecology

## 2014-05-21 DIAGNOSIS — Z1231 Encounter for screening mammogram for malignant neoplasm of breast: Secondary | ICD-10-CM

## 2014-07-27 ENCOUNTER — Encounter: Payer: Self-pay | Admitting: Obstetrics and Gynecology

## 2015-01-12 NOTE — Op Note (Signed)
PATIENT NAMEDIVINA, [name] MR#:  [id number] DATE OF BIRTH:  [birth year]  DATE OF PROCEDURE:  08/27/2012  PREOPERATIVE DIAGNOSIS: Ventral hernia.   POSTOPERATIVE DIAGNOSIS: Ventral hernia.   OPERATION: Repair of ventral hernia.   SURGEON: Whit Bruni S. Dejha King, MD   PROCEDURE: This is a patient who recently had a baby and complains of pain around the periumbilical area. When she stands up it hurts her in that area when we press. The patient was then brought to surgery. Under general anesthesia the abdomen was then prepped and draped. A small incision was made below the umbilicus. After cutting skin and subcutaneous tissue, the umbilicus was then detached from the hernia sac. The patient was found to have a very lax abdominal wall because of the pregnancy. I did not put any mesh in because she also wanted more children so I closed it with interrupted 0 Surgilon sutures and it was without any tension. It went very well. The umbilicus was then put back on with 3-0 Vicryl sutures and staples were applied. Marcaine was injected. The patient tolerated the procedure well and was sent to the recovery room in satisfactory condition.   ____________________________ Welford Roche Phylis Bougie, MD msh:drc D: 08/27/2012 10:17:53 ET T: 08/27/2012 11:30:38 ET JOB#: [id number]  cc: Miyonna Ormiston S. Phylis Bougie, MD, <Dictator> Sharene Butters MD ELECTRONICALLY SIGNED 09/02/2012 13:54

## 2015-10-28 ENCOUNTER — Emergency Department: Payer: BC Managed Care – PPO

## 2015-10-28 ENCOUNTER — Emergency Department
Admission: EM | Admit: 2015-10-28 | Discharge: 2015-10-28 | Disposition: A | Discharge status: Home / Self Care | Payer: BC Managed Care – PPO | Attending: Emergency Medicine | Admitting: Emergency Medicine

## 2015-10-28 ENCOUNTER — Encounter: Payer: Self-pay | Admitting: Emergency Medicine

## 2015-10-28 DIAGNOSIS — I10 Essential (primary) hypertension: Secondary | ICD-10-CM | POA: Insufficient documentation

## 2015-10-28 DIAGNOSIS — R079 Chest pain, unspecified: Secondary | ICD-10-CM | POA: Diagnosis not present

## 2015-10-28 LAB — BASIC METABOLIC PANEL
ANION GAP: 5 (ref 5–15)
BUN: 11 mg/dL (ref 6–20)
CALCIUM: 8.8 mg/dL — AB (ref 8.9–10.3)
CO2: 28 mmol/L (ref 22–32)
Chloride: 107 mmol/L (ref 101–111)
Creatinine, Ser: 0.66 mg/dL (ref 0.44–1.00)
GFR calc Af Amer: >60 mL/min (ref >60)
Glucose, Bld: 98 mg/dL (ref 65–99)
Potassium: 3.4 mmol/L — ABNORMAL LOW (ref 3.5–5.1)
Sodium: 140 mmol/L (ref 135–145)

## 2015-10-28 LAB — CBC
HCT: 34.8 % — ABNORMAL LOW (ref 35.0–47.0)
HEMOGLOBIN: 11.3 g/dL — AB (ref 12.0–16.0)
MCH: 26.6 pg (ref 26.0–34.0)
MCHC: 32.5 g/dL (ref 32.0–36.0)
MCV: 81.9 fL (ref 80.0–100.0)
Platelets: 272 10*3/uL (ref 150–440)
RBC: 4.25 MIL/uL (ref 3.80–5.20)
RDW: 13.7 % (ref 11.5–14.5)
WBC: 3.3 10*3/uL — ABNORMAL LOW (ref 3.6–11.0)

## 2015-10-28 LAB — TROPONIN I
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
## 2015-10-28 NOTE — ED Notes (Signed)
Pt states she was on the way to work this am when she began having "pinching" left sided cp and a "drawing" sensation down her left arm.  Has never had cp before, states she has a positive cardiac family history. Pt appears in no distress at this time, vss.

## 2015-10-28 NOTE — Discharge Instructions (Signed)
Nonspecific Chest Pain  °Chest pain can be caused by many different conditions. There is always a chance that your pain could be related to something serious, such as a heart attack or a blood clot in your lungs. Chest pain can also be caused by conditions that are not life-threatening. If you have chest pain, it is very important to follow up with your health care provider. °CAUSES  °Chest pain can be caused by: °· Heartburn. °· Pneumonia or bronchitis. °· Anxiety or stress. °· Inflammation around your heart (pericarditis) or lung (pleuritis or pleurisy). °· A blood clot in your lung. °· A collapsed lung (pneumothorax). It can develop suddenly on its own (spontaneous pneumothorax) or from trauma to the chest. °· Shingles infection (varicella-zoster virus). °· Heart attack. °· Damage to the bones, muscles, and cartilage that make up your chest wall. This can include: °¨ Bruised bones due to injury. °¨ Strained muscles or cartilage due to frequent or repeated coughing or overwork. °¨ Fracture to one or more ribs. °¨ Sore cartilage due to inflammation (costochondritis). °RISK FACTORS  °Risk factors for chest pain may include: °· Activities that increase your risk for trauma or injury to your chest. °· Respiratory infections or conditions that cause frequent coughing. °· Medical conditions or overeating that can cause heartburn. °· Heart disease or family history of heart disease. °· Conditions or health behaviors that increase your risk of developing a blood clot. °· Having had chicken pox (varicella zoster). °SIGNS AND SYMPTOMS °Chest pain can feel like: °· Burning or tingling on the surface of your chest or deep in your chest. °· Crushing, pressure, aching, or squeezing pain. °· Dull or sharp pain that is worse when you move, cough, or take a deep breath. °· Pain that is also felt in your back, neck, shoulder, or arm, or pain that spreads to any of these areas. °Your chest pain may come and go, or it may stay  constant. °DIAGNOSIS °Lab tests or other studies may be needed to find the cause of your pain. Your health care provider may have you take a test called an ambulatory ECG (electrocardiogram). An ECG records your heartbeat patterns at the time the test is performed. You may also have other tests, such as: °· Transthoracic echocardiogram (TTE). During echocardiography, sound waves are used to create a picture of all of the heart structures and to look at how blood flows through your heart. °· Transesophageal echocardiogram (TEE). This is a more advanced imaging test that obtains images from inside your body. It allows your health care provider to see your heart in finer detail. °· Cardiac monitoring. This allows your health care provider to monitor your heart rate and rhythm in real time. °· Holter monitor. This is a portable device that records your heartbeat and can help to diagnose abnormal heartbeats. It allows your health care provider to track your heart activity for several days, if needed. °· Stress tests. These can be done through exercise or by taking medicine that makes your heart beat more quickly. °· Blood tests. °· Imaging tests. °TREATMENT  °Your treatment depends on what is causing your chest pain. Treatment may include: °· Medicines. These may include: °¨ Acid blockers for heartburn. °¨ Anti-inflammatory medicine. °¨ Pain medicine for inflammatory conditions. °¨ Antibiotic medicine, if an infection is present. °¨ Medicines to dissolve blood clots. °¨ Medicines to treat coronary artery disease. °· Supportive care for conditions that do not require medicines. This may include: °¨ Resting. °¨ Applying heat   or cold packs to injured areas. °¨ Limiting activities until pain decreases. °HOME CARE INSTRUCTIONS °· If you were prescribed an antibiotic medicine, finish it all even if you start to feel better. °· Avoid any activities that bring on chest pain. °· Do not use any tobacco products, including  cigarettes, chewing tobacco, or electronic cigarettes. If you need help quitting, ask your health care provider. °· Do not drink alcohol. °· Take medicines only as directed by your health care provider. °· Keep all follow-up visits as directed by your health care provider. This is important. This includes any further testing if your chest pain does not go away. °· If heartburn is the cause for your chest pain, you may be told to keep your head raised (elevated) while sleeping. This reduces the chance that acid will go from your stomach into your esophagus. °· Make lifestyle changes as directed by your health care provider. These may include: °¨ Getting regular exercise. Ask your health care provider to suggest some activities that are safe for you. °¨ Eating a heart-healthy diet. A registered dietitian can help you to learn healthy eating options. °¨ Maintaining a healthy weight. °¨ Managing diabetes, if necessary. °¨ Reducing stress. °SEEK MEDICAL CARE IF: °· Your chest pain does not go away after treatment. °· You have a rash with blisters on your chest. °· You have a fever. °SEEK IMMEDIATE MEDICAL CARE IF:  °· Your chest pain is worse. °· You have an increasing cough, or you cough up blood. °· You have severe abdominal pain. °· You have severe weakness. °· You faint. °· You have chills. °· You have sudden, unexplained chest discomfort. °· You have sudden, unexplained discomfort in your arms, back, neck, or jaw. °· You have shortness of breath at any time. °· You suddenly start to sweat, or your skin gets clammy. °· You feel nauseous or you vomit. °· You suddenly feel light-headed or dizzy. °· Your heart begins to beat quickly, or it feels like it is skipping beats. °These symptoms may represent a serious problem that is an emergency. Do not wait to see if the symptoms will go away. Get medical help right away. Call your local emergency services (911 in the [place] Do not drive yourself to the hospital. °  °This  information is not intended to replace advice given to you by your health care provider. Make sure you discuss any questions you have with your health care provider. °  °Document Released: 06/21/2005 Document Revised: 10/02/2014 Document Reviewed: 04/17/2014 °Elsevier Interactive Patient Education ©2016 [employer] ° °

## 2015-11-08 ENCOUNTER — Emergency Department: Payer: BC Managed Care – PPO

## 2015-11-08 ENCOUNTER — Encounter: Payer: Self-pay | Admitting: Emergency Medicine

## 2015-11-08 ENCOUNTER — Emergency Department
Admission: EM | Admit: 2015-11-08 | Discharge: 2015-11-08 | Disposition: A | Discharge status: Home / Self Care | Payer: BC Managed Care – PPO | Attending: Emergency Medicine | Admitting: Emergency Medicine

## 2015-11-08 DIAGNOSIS — R079 Chest pain, unspecified: Secondary | ICD-10-CM | POA: Insufficient documentation

## 2015-11-08 DIAGNOSIS — Z79899 Other long term (current) drug therapy: Secondary | ICD-10-CM | POA: Insufficient documentation

## 2015-11-08 LAB — COMPREHENSIVE METABOLIC PANEL
ALT: 15 U/L (ref 14–54)
AST: 18 U/L (ref 15–41)
Albumin: 4.1 g/dL (ref 3.5–5.0)
Alkaline Phosphatase: 42 U/L (ref 38–126)
Anion gap: 5 (ref 5–15)
BUN: 5 mg/dL — ABNORMAL LOW (ref 6–20)
CHLORIDE: 107 mmol/L (ref 101–111)
CO2: 27 mmol/L (ref 22–32)
Calcium: 8.4 mg/dL — ABNORMAL LOW (ref 8.9–10.3)
Creatinine, Ser: 0.64 mg/dL (ref 0.44–1.00)
Glucose, Bld: 93 mg/dL (ref 65–99)
POTASSIUM: 3.3 mmol/L — AB (ref 3.5–5.1)
SODIUM: 139 mmol/L (ref 135–145)
Total Bilirubin: 0.3 mg/dL (ref 0.3–1.2)
Total Protein: 7 g/dL (ref 6.5–8.1)

## 2015-11-08 LAB — CBC
HEMATOCRIT: 35.1 % (ref 35.0–47.0)
HEMOGLOBIN: 11.2 g/dL — AB (ref 12.0–16.0)
MCH: 26.3 pg (ref 26.0–34.0)
MCHC: 32 g/dL (ref 32.0–36.0)
MCV: 82.2 fL (ref 80.0–100.0)
Platelets: 279 10*3/uL (ref 150–440)
RBC: 4.27 MIL/uL (ref 3.80–5.20)
RDW: 13.3 % (ref 11.5–14.5)
WBC: 5.2 10*3/uL (ref 3.6–11.0)

## 2015-11-08 LAB — TROPONIN I

## 2015-11-08 LAB — FIBRIN DERIVATIVES D-DIMER (ARMC ONLY): FIBRIN DERIVATIVES D-DIMER (ARMC): 643 — AB (ref 0–499)

## 2015-11-08 MED ORDER — SODIUM CHLORIDE 0.9 % IV BOLUS (SEPSIS): 1000.0000 mL | Freq: Once | INTRAVENOUS | Status: DC

## 2015-11-08 MED ORDER — IOHEXOL 350 MG/ML SOLN
75.0000 mL | Freq: Once | INTRAVENOUS | Status: AC | PRN
  Administered 2015-11-08: 75 mL via INTRAVENOUS
  Filled 2015-11-08: qty 75

## 2015-11-08 NOTE — ED Notes (Signed)
Pt called in lobby X 2, no answer.

## 2015-11-08 NOTE — ED Notes (Signed)
States had blood pressure check at work, noted elevated. States has had chest pain x 3 hours.
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
## 2015-11-08 NOTE — ED Notes (Signed)
CT Tech at bedside taking pt to CT

## 2015-11-08 NOTE — ED Notes (Signed)
Pt had few questions for MD. Dr. Clearnce Hasten made aware

## 2015-11-08 NOTE — ED Notes (Signed)
Pt verbalizes understanding of discharge instructions.

## 2016-05-20 ENCOUNTER — Emergency Department (HOSPITAL_COMMUNITY)
Admission: EM | Admit: 2016-05-20 | Discharge: 2016-05-20 | Disposition: A | Discharge status: Home / Self Care | Payer: BC Managed Care – PPO | Attending: Emergency Medicine | Admitting: Emergency Medicine

## 2016-05-20 ENCOUNTER — Encounter (HOSPITAL_COMMUNITY): Payer: Self-pay | Admitting: Emergency Medicine

## 2016-05-20 DIAGNOSIS — G43009 Migraine without aura, not intractable, without status migrainosus: Secondary | ICD-10-CM | POA: Diagnosis not present

## 2016-05-20 DIAGNOSIS — Z79899 Other long term (current) drug therapy: Secondary | ICD-10-CM | POA: Insufficient documentation

## 2016-05-20 DIAGNOSIS — I10 Essential (primary) hypertension: Secondary | ICD-10-CM | POA: Insufficient documentation

## 2016-05-20 DIAGNOSIS — R110 Nausea: Secondary | ICD-10-CM | POA: Diagnosis present

## 2016-05-20 LAB — CBC
HEMATOCRIT: 36.8 % (ref 36.0–46.0)
HEMOGLOBIN: 11.8 g/dL — AB (ref 12.0–15.0)
MCH: 26.2 pg (ref 26.0–34.0)
MCHC: 32.1 g/dL (ref 30.0–36.0)
MCV: 81.8 fL (ref 78.0–100.0)
Platelets: 303 10*3/uL (ref 150–400)
RBC: 4.5 MIL/uL (ref 3.87–5.11)
RDW: 13.1 % (ref 11.5–15.5)
WBC: 6.1 10*3/uL (ref 4.0–10.5)

## 2016-05-20 LAB — BASIC METABOLIC PANEL
ANION GAP: 6 (ref 5–15)
BUN: 7 mg/dL (ref 6–20)
CO2: 27 mmol/L (ref 22–32)
Calcium: 9.8 mg/dL (ref 8.9–10.3)
Chloride: 102 mmol/L (ref 101–111)
Creatinine, Ser: 0.65 mg/dL (ref 0.44–1.00)
GFR calc Af Amer: >60 mL/min (ref >60)
GFR calc non Af Amer: >60 mL/min (ref >60)
GLUCOSE: 124 mg/dL — AB (ref 65–99)
POTASSIUM: 3.2 mmol/L — AB (ref 3.5–5.1)
Sodium: 135 mmol/L (ref 135–145)

## 2016-05-20 LAB — PREGNANCY, URINE: Preg Test, Ur: NEGATIVE

## 2016-05-20 LAB — URINALYSIS, ROUTINE W REFLEX MICROSCOPIC
BILIRUBIN URINE: NEGATIVE
GLUCOSE, UA: NEGATIVE mg/dL
Ketones, ur: NEGATIVE mg/dL
Leukocytes, UA: NEGATIVE
Nitrite: NEGATIVE
PH: 8 (ref 5.0–8.0)
Protein, ur: NEGATIVE mg/dL
SPECIFIC GRAVITY, URINE: 1.015 (ref 1.005–1.030)

## 2016-05-20 LAB — URINE MICROSCOPIC-ADD ON

## 2016-05-20 MED ORDER — KETOROLAC TROMETHAMINE 30 MG/ML IJ SOLN
30.0000 mg | Freq: Once | INTRAMUSCULAR | Status: AC
  Administered 2016-05-20: 30 mg via INTRAVENOUS
  Filled 2016-05-20: qty 1

## 2016-05-20 MED ORDER — DIPHENHYDRAMINE HCL 50 MG/ML IJ SOLN
50.0000 mg | Freq: Once | INTRAMUSCULAR | Status: AC
  Administered 2016-05-20: 50 mg via INTRAVENOUS
  Filled 2016-05-20: qty 1

## 2016-05-20 MED ORDER — METOCLOPRAMIDE HCL 5 MG/ML IJ SOLN
10.0000 mg | Freq: Once | INTRAMUSCULAR | Status: AC
  Administered 2016-05-20: 10 mg via INTRAVENOUS
  Filled 2016-05-20: qty 2

## 2016-05-20 MED ORDER — SODIUM CHLORIDE 0.9 % IV BOLUS (SEPSIS)
1000.0000 mL | Freq: Once | INTRAVENOUS | Status: AC
  Administered 2016-05-20: 1000 mL via INTRAVENOUS

## 2016-05-20 NOTE — ED Triage Notes (Signed)
Pt c/o weakness and dizziness at this time. Pt c/o one short episode of blurry vision this morning.
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
## 2016-05-20 NOTE — Discharge Instructions (Signed)

## 2016-05-20 NOTE — ED Triage Notes (Signed)
Per EMS, Pt from home c/o headache, nausea since this morning. Pt took her bP at home and got 133/115 so pt took her losaartan which her doctor told her to stop taking. Per EMS, BP fine with EMS. Pt vomited once during transport.

## 2016-11-21 ENCOUNTER — Ambulatory Visit (INDEPENDENT_AMBULATORY_CARE_PROVIDER_SITE_OTHER): Payer: BC Managed Care – PPO | Admitting: Physician Assistant

## 2016-11-21 ENCOUNTER — Encounter: Payer: Self-pay | Admitting: Physician Assistant

## 2016-11-21 VITALS — BP 126/88 | HR 81 | Temp 98.3°F | Resp 14 | Ht 63.0 in | Wt 147.0 lb

## 2016-11-21 DIAGNOSIS — R195 Other fecal abnormalities: Secondary | ICD-10-CM | POA: Diagnosis not present

## 2016-11-21 DIAGNOSIS — D509 Iron deficiency anemia, unspecified: Secondary | ICD-10-CM

## 2016-11-21 DIAGNOSIS — K219 Gastro-esophageal reflux disease without esophagitis: Secondary | ICD-10-CM

## 2016-11-21 DIAGNOSIS — Z8249 Family history of ischemic heart disease and other diseases of the circulatory system: Secondary | ICD-10-CM | POA: Diagnosis not present

## 2016-11-21 DIAGNOSIS — R5383 Other fatigue: Secondary | ICD-10-CM

## 2016-11-21 DIAGNOSIS — R760 Raised antibody titer: Secondary | ICD-10-CM

## 2016-11-21 LAB — LIPID PANEL
CHOLESTEROL: 188 mg/dL (ref 0–200)
HDL: 68 mg/dL (ref >39.00)
LDL Cholesterol: 110 mg/dL — ABNORMAL HIGH (ref 0–99)
NONHDL: 119.58
Total CHOL/HDL Ratio: 3
Triglycerides: 50 mg/dL (ref 0.0–149.0)
VLDL: 10 mg/dL (ref 0.0–40.0)

## 2016-11-21 LAB — COMPREHENSIVE METABOLIC PANEL
ALBUMIN: 4.9 g/dL (ref 3.5–5.2)
ALK PHOS: 71 U/L (ref 39–117)
ALT: 19 U/L (ref 0–35)
AST: 22 U/L (ref 0–37)
BUN: 8 mg/dL (ref 6–23)
CALCIUM: 10.3 mg/dL (ref 8.4–10.5)
CO2: 31 mEq/L (ref 19–32)
Chloride: 101 mEq/L (ref 96–112)
Creatinine, Ser: 0.73 mg/dL (ref 0.40–1.20)
GFR: 110.42 mL/min (ref >60.00)
Glucose, Bld: 83 mg/dL (ref 70–99)
POTASSIUM: 4.2 meq/L (ref 3.5–5.1)
Sodium: 140 mEq/L (ref 135–145)
TOTAL PROTEIN: 7.9 g/dL (ref 6.0–8.3)
Total Bilirubin: 0.5 mg/dL (ref 0.2–1.2)

## 2016-11-21 LAB — URINALYSIS, ROUTINE W REFLEX MICROSCOPIC
BILIRUBIN URINE: NEGATIVE
LEUKOCYTES UA: NEGATIVE
Nitrite: NEGATIVE
Specific Gravity, Urine: <=1.005 — AB (ref 1.000–1.030)
TOTAL PROTEIN, URINE-UPE24: NEGATIVE
UROBILINOGEN UA: 0.2 (ref 0.0–1.0)
Urine Glucose: NEGATIVE
WBC, UA: NONE SEEN (ref 0–?)
pH: 6.5 (ref 5.0–8.0)

## 2016-11-21 LAB — T4, FREE: Free T4: 1 ng/dL (ref 0.60–1.60)

## 2016-11-21 LAB — CBC
HEMATOCRIT: 40.8 % (ref 36.0–46.0)
HEMOGLOBIN: 13.2 g/dL (ref 12.0–15.0)
MCHC: 32.3 g/dL (ref 30.0–36.0)
MCV: 81.2 fl (ref 78.0–100.0)
PLATELETS: 328 10*3/uL (ref 150.0–400.0)
RBC: 5.02 Mil/uL (ref 3.87–5.11)
RDW: 13.5 % (ref 11.5–15.5)
WBC: 5 10*3/uL (ref 4.0–10.5)

## 2016-11-21 LAB — HEMOGLOBIN A1C: HEMOGLOBIN A1C: 6.1 % (ref 4.6–6.5)

## 2016-11-21 LAB — TSH: TSH: 1.17 u[IU]/mL (ref 0.35–4.50)

## 2016-11-21 LAB — VITAMIN B12: Vitamin B-12: 417 pg/mL (ref 211–911)

## 2016-11-21 MED ORDER — PANTOPRAZOLE SODIUM 40 MG PO TBEC: 40.0000 mg | DELAYED_RELEASE_TABLET | Freq: Every day | ORAL | 1 refills | Status: DC

## 2016-11-21 NOTE — Progress Notes (Signed)
Pre visit review using our clinic review tool, if applicable. No additional management support is needed unless otherwise documented below in the visit note. 
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
## 2016-11-21 NOTE — Patient Instructions (Addendum)
Please go to the lab for blood work. I will call with your results.  I want you to start the diet below. Start taking a Protonix daily as directed.  You will be contacted by Gastroenterology for a repeat colonoscopy and assessment of swallowing issues.  Follow-up with me in 2 weeks.  Get a new BP cuff as your current one is inaccurate.  BP looks good today. No further medication needed at present.  Keep a daily check on BP and write down.  Bring to follow-up in 2 weeks.  For loose stools, increase fiber intake and get good hydration. This may be a mild gastroenteritis (viral). Start a daily probiotic. If not resolving and labs are normal, we may need to get a stool sample for testing.    Food Choices for Gastroesophageal Reflux Disease, Adult When you have gastroesophageal reflux disease (GERD), the foods you eat and your eating habits are very important. Choosing the right foods can help ease your discomfort. What guidelines do I need to follow?  Choose fruits, vegetables, whole grains, and low-fat dairy products.  Choose low-fat meat, fish, and poultry.  Limit fats such as oils, salad dressings, butter, nuts, and avocado.  Keep a food diary. This helps you identify foods that cause symptoms.  Avoid foods that cause symptoms. These may be different for everyone.  Eat small meals often instead of 3 large meals a day.  Eat your meals slowly, in a place where you are relaxed.  Limit fried foods.  Cook foods using methods other than frying.  Avoid drinking alcohol.  Avoid drinking large amounts of liquids with your meals.  Avoid bending over or lying down until 2-3 hours after eating. What foods are not recommended? These are some foods and drinks that may make your symptoms worse: Vegetables  Tomatoes. Tomato juice. Tomato and spaghetti sauce. Chili peppers. Onion and garlic. Horseradish. Fruits  Oranges, grapefruit, and lemon (fruit and juice). Meats  High-fat  meats, fish, and poultry. This includes hot dogs, ribs, ham, sausage, salami, and bacon. Dairy  Whole milk and chocolate milk. Sour cream. Cream. Butter. Ice cream. Cream cheese. Drinks  Coffee and tea. Bubbly (carbonated) drinks or energy drinks. Condiments  Hot sauce. Barbecue sauce. Sweets/Desserts  Chocolate and cocoa. Donuts. Peppermint and spearmint. Fats and Oils  High-fat foods. This includes [place] fries and potato chips. Other  Vinegar. Strong spices. This includes black pepper, white pepper, red pepper, cayenne, curry powder, cloves, ginger, and chili powder. The items listed above may not be a complete list of foods and drinks to avoid. Contact your dietitian for more information.  This information is not intended to replace advice given to you by your health care provider. Make sure you discuss any questions you have with your health care provider. Document Released: 03/12/2012 Document Revised: 02/17/2016 Document Reviewed: 07/16/2013 Elsevier Interactive Patient Education  2017 [employer].

## 2016-11-24 LAB — VITAMIN D 1,25 DIHYDROXY
Vitamin D 1, 25 (OH)2 Total: 38 pg/mL (ref 18–72)
Vitamin D2 1, 25 (OH)2: <8 pg/mL
Vitamin D3 1, 25 (OH)2: 38 pg/mL

## 2016-12-08 ENCOUNTER — Encounter (HOSPITAL_COMMUNITY): Payer: Self-pay

## 2016-12-08 ENCOUNTER — Emergency Department (HOSPITAL_COMMUNITY)
Admission: EM | Admit: 2016-12-08 | Discharge: 2016-12-09 | Disposition: A | Discharge status: Home / Self Care | Payer: BC Managed Care – PPO | Attending: Emergency Medicine | Admitting: Emergency Medicine

## 2016-12-08 DIAGNOSIS — M79605 Pain in left leg: Secondary | ICD-10-CM | POA: Insufficient documentation

## 2016-12-08 DIAGNOSIS — Z79899 Other long term (current) drug therapy: Secondary | ICD-10-CM | POA: Diagnosis not present

## 2016-12-08 DIAGNOSIS — M7989 Other specified soft tissue disorders: Secondary | ICD-10-CM | POA: Diagnosis present

## 2016-12-08 HISTORY — DX: Essential (primary) hypertension: I10

## 2016-12-08 IMAGING — CR DG CHEST 2V
1 series · 2 of 2 positions shown · non-contrast
Comparison: 10/28/2015

CLINICAL DATA: Elevated blood pressure with chest pain for 3 hours

EXAM:
CHEST  2 VIEW

[Series 1: dg chest 2 view · 0.14mm/px · 2 of 2 slices shown]
[im 1/2]
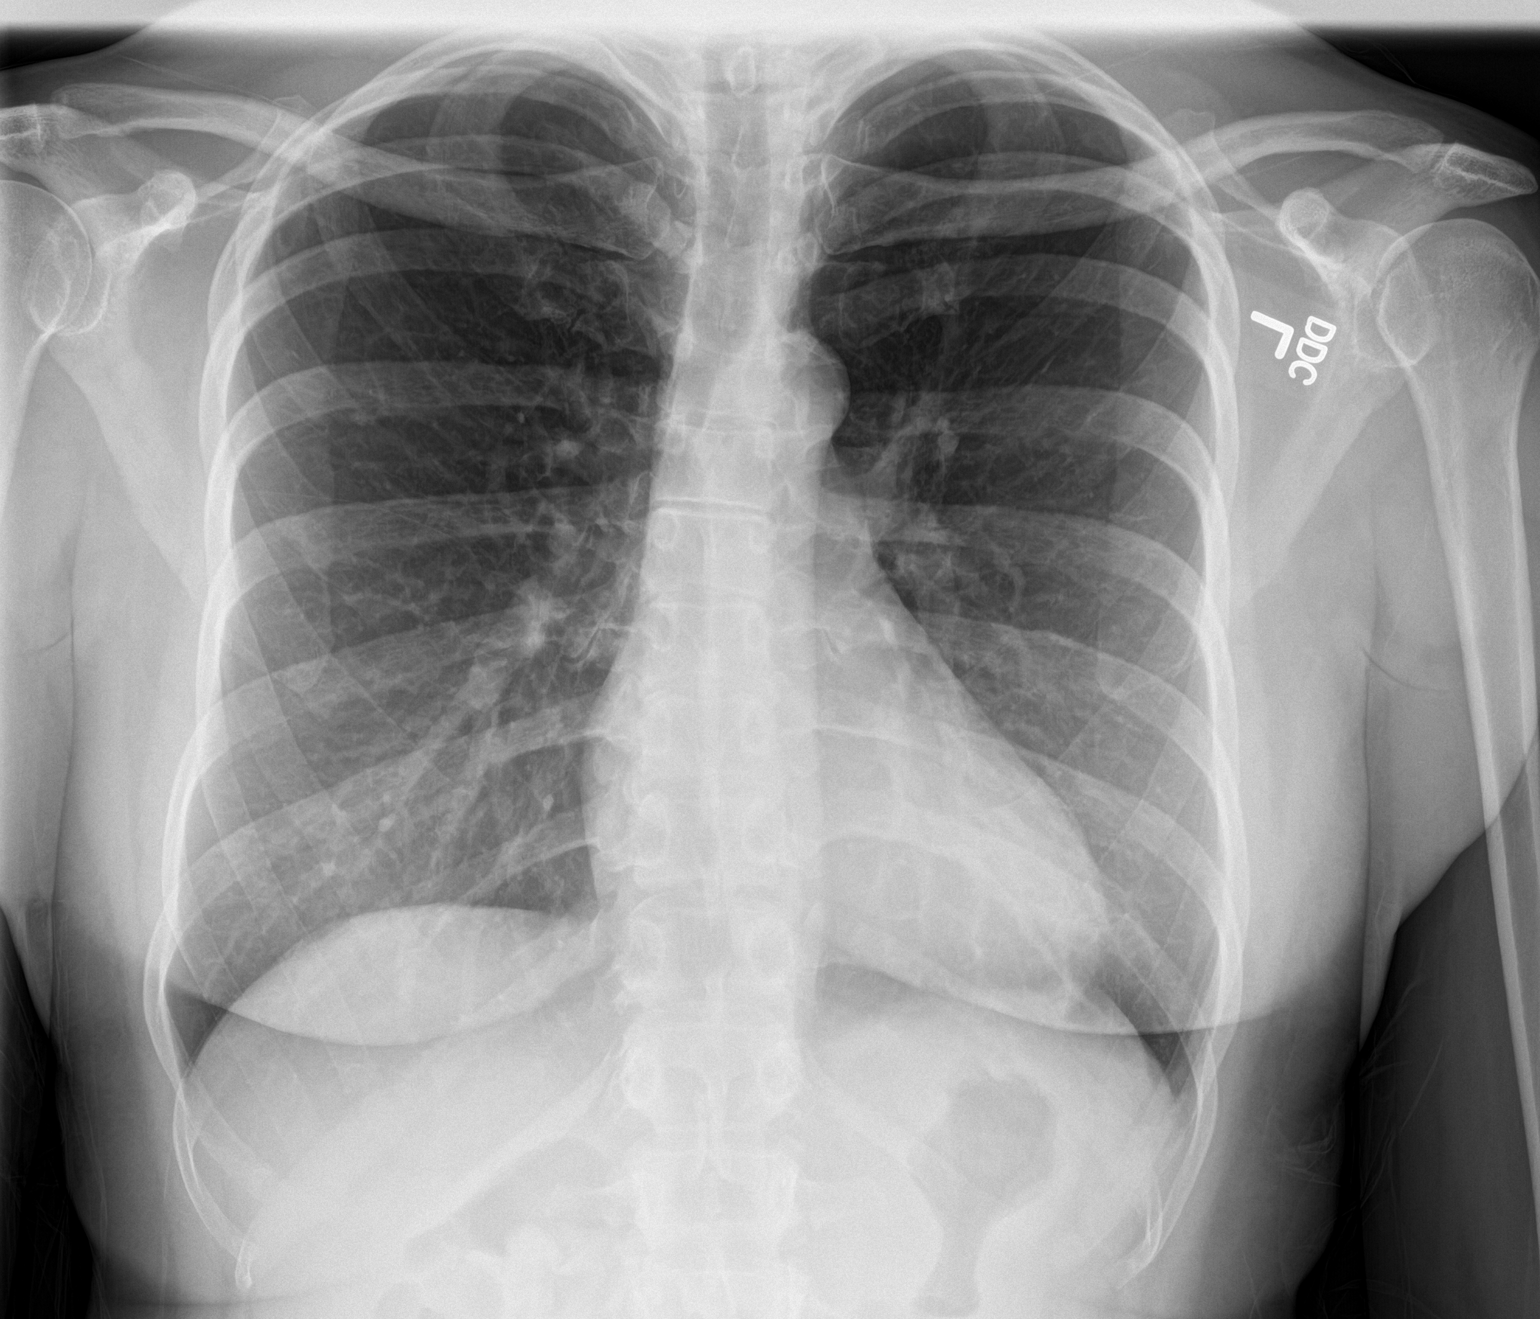
[im 2/2]
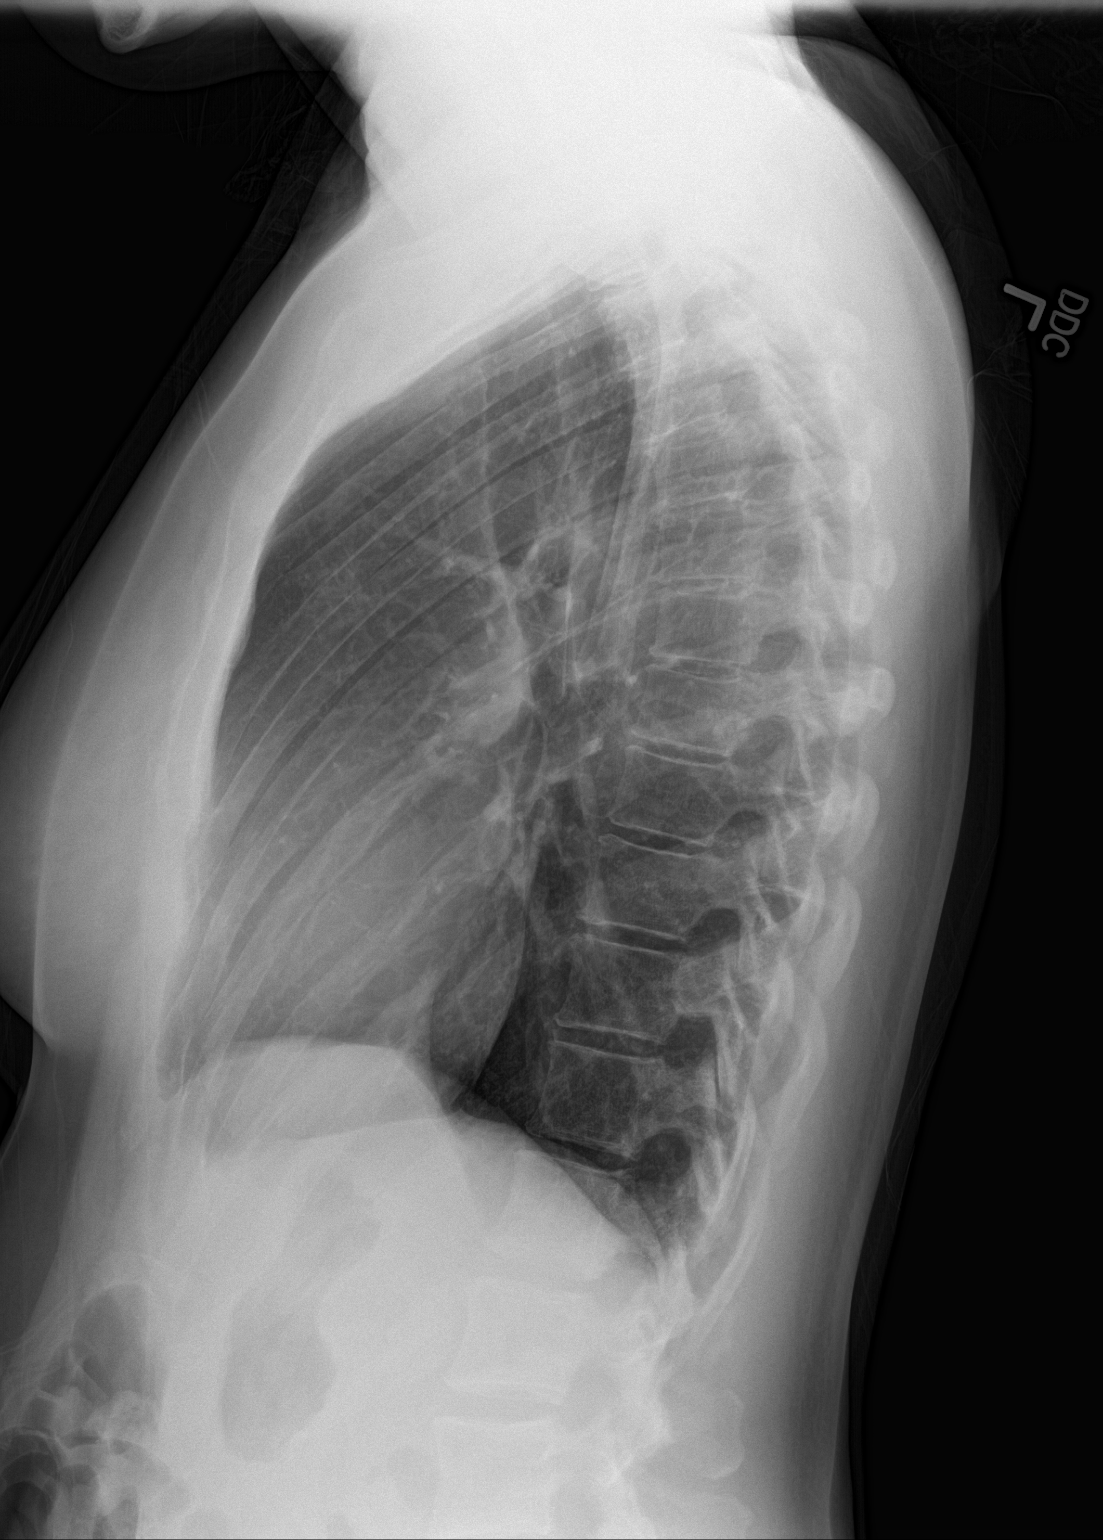

[2 of 2 positions shown; findings below may reference images not displayed]

FINDINGS: The heart size and mediastinal contours are within normal limits.
Both lungs are clear. The visualized skeletal structures are
unremarkable.
IMPRESSION: No active cardiopulmonary disease.

## 2016-12-08 NOTE — ED Notes (Signed)
Bed: WA12 Expected date:  Expected time:  Means of arrival:  Comments: TR 

## 2016-12-08 NOTE — ED Triage Notes (Signed)
Pt here with left lower leg pain in calf. Pt is on birth control patch.  No smoker.  Pain is worse with movement.

## 2016-12-08 NOTE — ED Notes (Signed)
PATIENT STATES LEFT FOOT AND ANKLE ARE STARTING TO FEEL COLD. PATIENT HAS GOOD PMS ON LEFT FOOT.

## 2016-12-09 ENCOUNTER — Encounter (HOSPITAL_COMMUNITY): Payer: Self-pay | Admitting: Radiology

## 2016-12-09 ENCOUNTER — Emergency Department (HOSPITAL_BASED_OUTPATIENT_CLINIC_OR_DEPARTMENT_OTHER)
Admit: 2016-12-09 | Discharge: 2016-12-09 | Disposition: A | Discharge status: Home / Self Care | Payer: BC Managed Care – PPO | Attending: Emergency Medicine | Admitting: Emergency Medicine

## 2016-12-09 ENCOUNTER — Emergency Department (HOSPITAL_COMMUNITY): Payer: BC Managed Care – PPO

## 2016-12-09 DIAGNOSIS — M79609 Pain in unspecified limb: Secondary | ICD-10-CM

## 2016-12-09 LAB — I-STAT CHEM 8, ED
BUN: 8 mg/dL (ref 6–20)
CHLORIDE: 103 mmol/L (ref 101–111)
CREATININE: 0.8 mg/dL (ref 0.44–1.00)
Calcium, Ion: 1.22 mmol/L (ref 1.15–1.40)
GLUCOSE: 82 mg/dL (ref 65–99)
HEMATOCRIT: 36 % (ref 36.0–46.0)
HEMOGLOBIN: 12.2 g/dL (ref 12.0–15.0)
POTASSIUM: 3.7 mmol/L (ref 3.5–5.1)
Sodium: 143 mmol/L (ref 135–145)
TCO2: 30 mmol/L (ref 0–100)

## 2016-12-09 LAB — I-STAT BETA HCG BLOOD, ED (MC, WL, AP ONLY)

## 2016-12-09 MED ORDER — IOPAMIDOL (ISOVUE-370) INJECTION 76%
INTRAVENOUS | Status: AC
  Filled 2016-12-09: qty 100

## 2016-12-09 MED ORDER — IOPAMIDOL (ISOVUE-370) INJECTION 76%
100.0000 mL | Freq: Once | INTRAVENOUS | Status: AC | PRN
  Administered 2016-12-09: 100 mL via INTRAVENOUS

## 2016-12-09 NOTE — Progress Notes (Signed)
*  Preliminary Results* Left lower extremity venous duplex completed. Left lower extremity is negative for deep vein thrombosis. There is no evidence of left Baker's cyst.  12/09/2016 8:50 AM  Maudry Mayhew, BS, RVT, RDCS, RDMS
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
## 2016-12-09 NOTE — Discharge Instructions (Signed)
There is no evidence found for a blood clot either in your legs or chest.  Therefore you are safe to go home.  If your symptoms worsen or you become concerned you can return here.  It is important to follow-up with your primary care doctor since your condition could worsen, and you may need to have additional testing done.  For the swelling in your left leg you can try elevating your legs above your heart several times a day.  When you are not resting it is a good idea to get some exercise such as gentle walking.  You can use Tylenol, if needed, for pain.

## 2016-12-09 NOTE — ED Provider Notes (Signed)
08:45- Doppler  Negative for DVT.  She apparently presented for evaluation of left leg pain with a cramping sensation and suspected DVT because of swelling.  She also had some vague chest discomfort and dyspnea on exertion.  Patient states her PCP has been evaluating her for fatigue and done a number of tests.  At this time she is comfortable, denies chest pain, shortness of breath, focal weakness or dizziness.  Left leg is mildly swollen with 1+ edema.  There is mild left calf tenderness but no left popliteal tenderness.  Medical decision making-nonspecific left leg swelling without evidence for DVT.  Nonspecific chest and respiratory symptoms.  Doubt venous thromboembolism disorder at this time.  The patient has general malaise, and is currently being evaluated by her PCP.  She is stable for discharge with outpatient management.  Nursing Notes Reviewed/ Care Coordinated Applicable Imaging Reviewed Interpretation of Laboratory Data incorporated into ED treatment  The patient appears reasonably screened and/or stabilized for discharge and I doubt any other medical condition or other [facility] requiring further screening, evaluation, or treatment in the ED at this time prior to discharge.  Plan: Home Medications-continue usual treatments; Home Treatments-rest; return here if the recommended treatment, does not improve the symptoms; Recommended follow up-PCP, four or 5 days for checkup     Daleen Bo, MD 12/09/16 [id number]

## 2017-01-21 ENCOUNTER — Other Ambulatory Visit: Payer: Self-pay | Admitting: Physician Assistant

## 2017-02-17 ENCOUNTER — Emergency Department (HOSPITAL_COMMUNITY)
Admission: EM | Admit: 2017-02-17 | Discharge: 2017-02-17 | Disposition: A | Discharge status: Home / Self Care | Payer: BC Managed Care – PPO | Attending: Physician Assistant | Admitting: Physician Assistant

## 2017-02-17 ENCOUNTER — Encounter (HOSPITAL_COMMUNITY): Payer: Self-pay | Admitting: Emergency Medicine

## 2017-02-17 DIAGNOSIS — I10 Essential (primary) hypertension: Secondary | ICD-10-CM | POA: Insufficient documentation

## 2017-02-17 DIAGNOSIS — H1131 Conjunctival hemorrhage, right eye: Secondary | ICD-10-CM | POA: Diagnosis not present

## 2017-02-17 DIAGNOSIS — Z79899 Other long term (current) drug therapy: Secondary | ICD-10-CM | POA: Diagnosis not present

## 2017-02-17 DIAGNOSIS — H578 Other specified disorders of eye and adnexa: Secondary | ICD-10-CM | POA: Diagnosis present

## 2017-02-17 MED ORDER — FLUORESCEIN SODIUM 0.6 MG OP STRP
1.0000 | ORAL_STRIP | Freq: Once | OPHTHALMIC | Status: AC
  Administered 2017-02-17: 1 via OPHTHALMIC
  Filled 2017-02-17: qty 1

## 2017-02-17 MED ORDER — TETRACAINE HCL 0.5 % OP SOLN
2.0000 [drp] | Freq: Once | OPHTHALMIC | Status: AC
  Administered 2017-02-17: 2 [drp] via OPHTHALMIC
  Filled 2017-02-17: qty 2

## 2017-02-17 MED ORDER — ERYTHROMYCIN 5 MG/GM OP OINT
1.0000 "application " | TOPICAL_OINTMENT | Freq: Every day | OPHTHALMIC | Status: DC
  Administered 2017-02-17: 1 via OPHTHALMIC
  Filled 2017-02-17: qty 3.5

## 2017-02-17 NOTE — ED Notes (Signed)
Declined W/C at D/C and was escorted to lobby by RN. 
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
## 2017-02-17 NOTE — ED Triage Notes (Signed)
Pt c/o right eye redness from broken blood vessels onset Thursday. Pt reports seeing a speck in her eye and feeling pressure Pt presents with black spot noted in right eye with redness.

## 2017-02-17 NOTE — Discharge Instructions (Signed)
Please call your ophthalmologist at Dr. Zenia Resides office when they reopen on Monday or Tuesday, depending on [facility] hours at their office. If you develop any new or worsening symptoms, please return to the Emergency Department for re-evaluation. Please apply a ribbon of erythromycin ointment around the length of the tip of your pinky finger to your right eye 4 times per day for the next 5 days.

## 2017-04-02 DIAGNOSIS — R1031 Right lower quadrant pain: Secondary | ICD-10-CM | POA: Insufficient documentation

## 2017-04-02 LAB — HM MAMMOGRAPHY: HM Mammogram: NORMAL (ref 0–4)

## 2017-04-05 ENCOUNTER — Ambulatory Visit (INDEPENDENT_AMBULATORY_CARE_PROVIDER_SITE_OTHER): Payer: BC Managed Care – PPO | Admitting: Physician Assistant

## 2017-04-05 ENCOUNTER — Encounter: Payer: Self-pay | Admitting: Physician Assistant

## 2017-04-05 VITALS — BP 118/80 | HR 79 | Temp 98.4°F | Resp 14 | Ht 63.0 in | Wt 151.0 lb

## 2017-04-05 DIAGNOSIS — I10 Essential (primary) hypertension: Secondary | ICD-10-CM | POA: Diagnosis not present

## 2017-04-05 MED ORDER — HYDROCHLOROTHIAZIDE 12.5 MG PO TABS: 12.5000 mg | ORAL_TABLET | Freq: Every day | ORAL | 1 refills | Status: DC
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
## 2017-04-05 NOTE — Patient Instructions (Signed)
Please start the Hydrochlorothiazide as directed once daily. Stay well-hydrated and eat a well-balanced diet.  Check home BP at home and record measurements. Bring to follow-up in 2 weeks.  If you note any low BP < 110/60 or note any lightheadedness or dizziness, please stop the medication and call me.

## 2017-04-05 NOTE — Progress Notes (Signed)
Pre visit review using our clinic review tool, if applicable. No additional management support is needed unless otherwise documented below in the visit note. 

## 2017-04-05 NOTE — Assessment & Plan Note (Signed)
BP normotensive today. However, patient with a log of home BP with up to Stage II hypertension. Will start DASH diet. Start low dose HCTZ 12.5 mg with instructions to closely monitor BP at home. Follow-up in 2 weeks for reassessment.

## 2017-04-18 ENCOUNTER — Encounter: Payer: Self-pay | Admitting: Physician Assistant

## 2017-04-18 ENCOUNTER — Ambulatory Visit (INDEPENDENT_AMBULATORY_CARE_PROVIDER_SITE_OTHER): Payer: BC Managed Care – PPO | Admitting: Physician Assistant

## 2017-04-18 VITALS — BP 108/70 | HR 79 | Temp 98.6°F | Resp 14 | Ht 63.0 in | Wt 153.0 lb

## 2017-04-18 DIAGNOSIS — I10 Essential (primary) hypertension: Secondary | ICD-10-CM | POA: Diagnosis not present

## 2017-04-18 DIAGNOSIS — K219 Gastro-esophageal reflux disease without esophagitis: Secondary | ICD-10-CM

## 2017-04-18 DIAGNOSIS — R0789 Other chest pain: Secondary | ICD-10-CM

## 2017-04-18 LAB — BASIC METABOLIC PANEL
BUN: 9 mg/dL (ref 6–23)
CALCIUM: 9.7 mg/dL (ref 8.4–10.5)
CO2: 33 meq/L — AB (ref 19–32)
CREATININE: 0.7 mg/dL (ref 0.40–1.20)
Chloride: 103 mEq/L (ref 96–112)
GFR: 115.69 mL/min (ref >60.00)
GLUCOSE: 83 mg/dL (ref 70–99)
Potassium: 3.7 mEq/L (ref 3.5–5.1)
SODIUM: 140 meq/L (ref 135–145)

## 2017-04-18 MED ORDER — GI COCKTAIL ~~LOC~~
30.0000 mL | Freq: Once | ORAL | Status: AC
  Administered 2017-04-18: 30 mL via ORAL

## 2017-04-18 NOTE — Patient Instructions (Addendum)
Please restart Protonix and start an OTC Zantac 150 mg twice daily over the next two week.  See if this makes a significant improvement in symptoms. I am setting you up with Gastroenterology for further assessment of these atypical symptoms as cardiac workup has been negative.  BP is much improved today. We will recheck potassium level today. I will call you with results. If symptoms worsen, please go to the ER for assessment.   Please continue BP medication as directed.  We will follow-up in 1 month for repeat assessment.
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
## 2017-04-18 NOTE — Progress Notes (Signed)
Pre visit review using our clinic review tool, if applicable. No additional management support is needed unless otherwise documented below in the visit note. 

## 2017-04-19 ENCOUNTER — Ambulatory Visit: Payer: BC Managed Care – PPO | Admitting: Physician Assistant

## 2017-04-20 ENCOUNTER — Encounter: Payer: Self-pay | Admitting: Gastroenterology

## 2017-04-24 ENCOUNTER — Encounter: Payer: Self-pay | Admitting: Gastroenterology

## 2017-04-24 ENCOUNTER — Ambulatory Visit (INDEPENDENT_AMBULATORY_CARE_PROVIDER_SITE_OTHER): Payer: BC Managed Care – PPO | Admitting: Gastroenterology

## 2017-04-24 VITALS — BP 116/80 | HR 72 | Ht 63.0 in | Wt 151.5 lb

## 2017-04-24 DIAGNOSIS — R1310 Dysphagia, unspecified: Secondary | ICD-10-CM

## 2017-04-24 DIAGNOSIS — R0789 Other chest pain: Secondary | ICD-10-CM | POA: Diagnosis not present

## 2017-04-24 MED ORDER — OMEPRAZOLE 40 MG PO CPDR: 40.0000 mg | DELAYED_RELEASE_CAPSULE | Freq: Two times a day (BID) | ORAL | 3 refills | Status: DC

## 2017-04-24 NOTE — Patient Instructions (Signed)
If you are age 47 or older, your body mass index should be between 23-30. Your Body mass index is 26.84 kg/m. If this is out of the aforementioned range listed, please consider follow up with your Primary Care Provider.  If you are age 66 or younger, your body mass index should be between 19-25. Your Body mass index is 26.84 kg/m. If this is out of the aformentioned range listed, please consider follow up with your Primary Care Provider.   We have sent the following medications to your pharmacy for you to pick up at your convenience:  Omeprazole  You have been scheduled for an endoscopy. Please follow written instructions given to you at your visit today. If you use inhalers (even only as needed), please bring them with you on the day of your procedure. Your physician has requested that you go to www.[email or web address].com and enter the access code given to you at your visit today. This web site gives a general overview about your procedure. However, you should still follow specific instructions given to you by our office regarding your preparation for the procedure.  Thank you.
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
## 2017-05-01 ENCOUNTER — Encounter: Payer: Self-pay | Admitting: Gastroenterology

## 2017-05-09 ENCOUNTER — Telehealth: Payer: Self-pay | Admitting: Gastroenterology

## 2017-05-09 NOTE — Telephone Encounter (Signed)
Okay thanks for letting me know. She can reschedule at her convenience.  

## 2017-05-10 ENCOUNTER — Encounter: Payer: BC Managed Care – PPO | Admitting: Gastroenterology

## 2017-05-31 ENCOUNTER — Encounter: Payer: Self-pay | Admitting: Emergency Medicine

## 2017-05-31 ENCOUNTER — Other Ambulatory Visit: Payer: Self-pay | Admitting: Physician Assistant

## 2017-06-12 ENCOUNTER — Encounter: Payer: Self-pay | Admitting: Radiology

## 2017-06-12 ENCOUNTER — Other Ambulatory Visit: Payer: Self-pay | Admitting: Obstetrics and Gynecology

## 2017-06-12 DIAGNOSIS — K5730 Diverticulosis of large intestine without perforation or abscess without bleeding: Secondary | ICD-10-CM | POA: Insufficient documentation

## 2017-06-12 DIAGNOSIS — Z8719 Personal history of other diseases of the digestive system: Secondary | ICD-10-CM

## 2017-06-12 DIAGNOSIS — R109 Unspecified abdominal pain: Secondary | ICD-10-CM

## 2017-06-13 ENCOUNTER — Ambulatory Visit
Admission: RE | Admit: 2017-06-13 | Discharge: 2017-06-13 | Disposition: A | Discharge status: Home / Self Care | Payer: BC Managed Care – PPO | Source: Ambulatory Visit | Attending: Obstetrics and Gynecology | Admitting: Obstetrics and Gynecology

## 2017-06-13 DIAGNOSIS — Z8719 Personal history of other diseases of the digestive system: Secondary | ICD-10-CM

## 2017-06-13 DIAGNOSIS — R109 Unspecified abdominal pain: Secondary | ICD-10-CM

## 2017-06-13 MED ORDER — IOPAMIDOL (ISOVUE-300) INJECTION 61%
100.0000 mL | Freq: Once | INTRAVENOUS | Status: AC | PRN
  Administered 2017-06-13: 100 mL via INTRAVENOUS

## 2017-06-13 MED ORDER — IOHEXOL 300 MG/ML  SOLN
30.0000 mL | Freq: Once | INTRAMUSCULAR | Status: AC | PRN
  Administered 2017-06-13: 30 mL via ORAL

## 2017-06-14 ENCOUNTER — Encounter: Payer: Self-pay | Admitting: Physician Assistant

## 2017-06-14 ENCOUNTER — Ambulatory Visit (INDEPENDENT_AMBULATORY_CARE_PROVIDER_SITE_OTHER): Payer: BC Managed Care – PPO | Admitting: Physician Assistant

## 2017-06-14 ENCOUNTER — Encounter: Payer: Self-pay | Admitting: Emergency Medicine

## 2017-06-14 VITALS — BP 102/72 | HR 83 | Temp 98.6°F | Resp 14 | Ht 63.0 in | Wt 147.0 lb

## 2017-06-14 DIAGNOSIS — K5792 Diverticulitis of intestine, part unspecified, without perforation or abscess without bleeding: Secondary | ICD-10-CM | POA: Diagnosis not present

## 2017-06-14 DIAGNOSIS — N8000 Endometriosis of the uterus, unspecified: Secondary | ICD-10-CM | POA: Insufficient documentation

## 2017-06-14 LAB — BASIC METABOLIC PANEL
BUN: 7 mg/dL (ref 6–23)
CALCIUM: 9.7 mg/dL (ref 8.4–10.5)
CO2: 31 mEq/L (ref 19–32)
Chloride: 100 mEq/L (ref 96–112)
Creatinine, Ser: 0.81 mg/dL (ref 0.40–1.20)
GFR: 97.69 mL/min (ref >60.00)
Glucose, Bld: 107 mg/dL — ABNORMAL HIGH (ref 70–99)
POTASSIUM: 3.5 meq/L (ref 3.5–5.1)
Sodium: 139 mEq/L (ref 135–145)

## 2017-06-14 LAB — CBC WITH DIFFERENTIAL/PLATELET
BASOS PCT: 0.4 % (ref 0.0–3.0)
Basophils Absolute: 0 10*3/uL (ref 0.0–0.1)
EOS ABS: 0 10*3/uL (ref 0.0–0.7)
Eosinophils Relative: 0.4 % (ref 0.0–5.0)
HEMATOCRIT: 36 % (ref 36.0–46.0)
Hemoglobin: 11.6 g/dL — ABNORMAL LOW (ref 12.0–15.0)
Lymphocytes Relative: 30.9 % (ref 12.0–46.0)
Lymphs Abs: 1.3 10*3/uL (ref 0.7–4.0)
MCHC: 32.2 g/dL (ref 30.0–36.0)
MCV: 81.8 fl (ref 78.0–100.0)
MONO ABS: 0.4 10*3/uL (ref 0.1–1.0)
Monocytes Relative: 9.3 % (ref 3.0–12.0)
NEUTROS ABS: 2.5 10*3/uL (ref 1.4–7.7)
Neutrophils Relative %: 59 % (ref 43.0–77.0)
PLATELETS: 329 10*3/uL (ref 150.0–400.0)
RBC: 4.4 Mil/uL (ref 3.87–5.11)
RDW: 13.5 % (ref 11.5–15.5)
WBC: 4.2 10*3/uL (ref 4.0–10.5)

## 2017-06-14 IMAGING — CT CT ABD-PELV W/ CM
3 of 5 series · 13 of 36 positions shown, 19 images · IV contrast (OMNI 300/WATER & [ID] ISOVUE 300)
Comparison: None.

CLINICAL DATA: Left lower quadrant abdominal pain x5 days, nausea/
vomiting, fever, hematuria

EXAM:
CT ABDOMEN AND PELVIS WITH CONTRAST
TECHNIQUE: Multidetector CT imaging of the abdomen and pelvis was performed
using the standard protocol following bolus administration of
intravenous contrast.
CONTRAST:  30mL OMNIPAQUE IOHEXOL 300 MG/ML SOLN, 100mL L1BEBG-JTT
IOPAMIDOL (L1BEBG-JTT) INJECTION 61%

[Series 4: abd/pelvis with · axial · 0.70mm/px · z∈[-290,-15]mm · 6 of 77 slices shown, 11 images]
[im 11/77  soft-tissue]
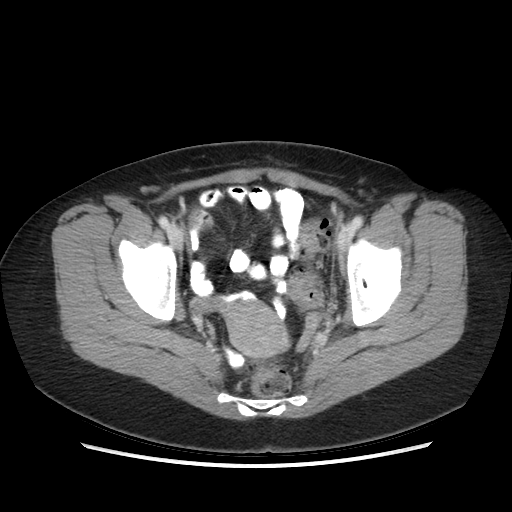
[im 11/77  bone]
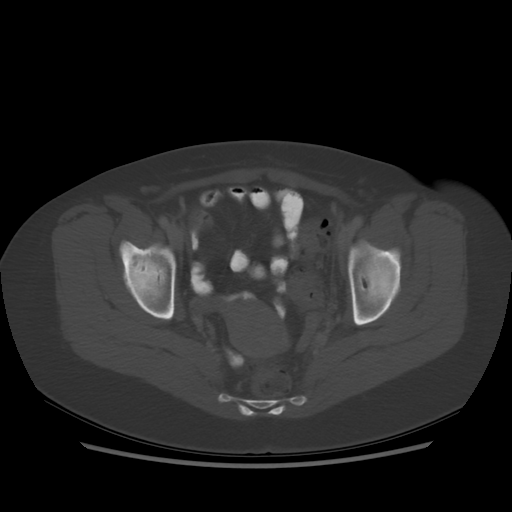
[im 22/77  soft-tissue]
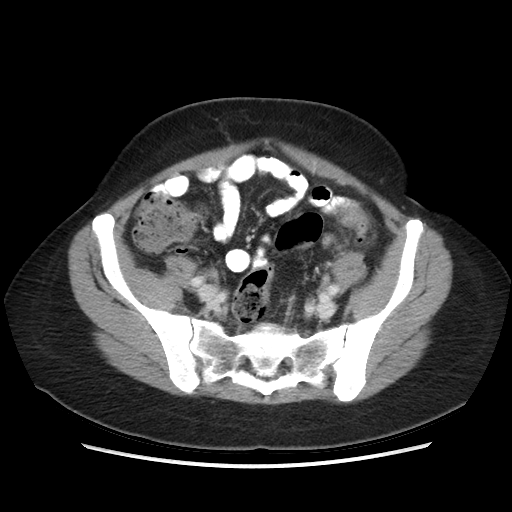
[im 33/77  soft-tissue]
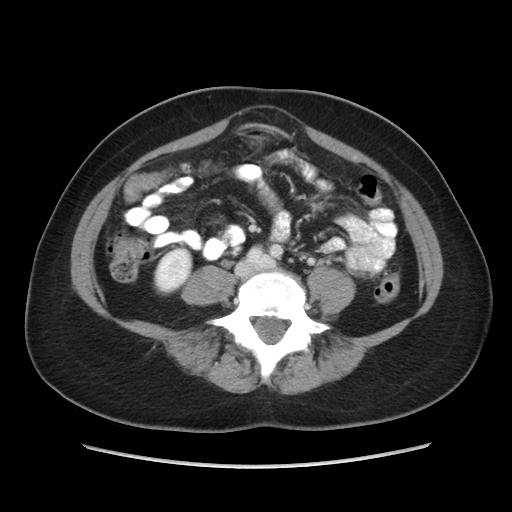
[im 33/77  lung]
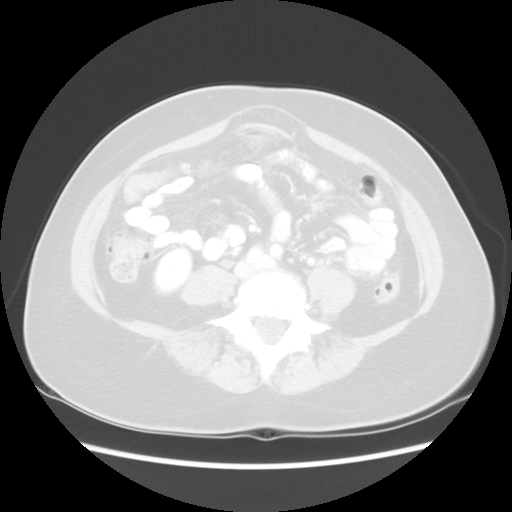
[im 44/77  soft-tissue]
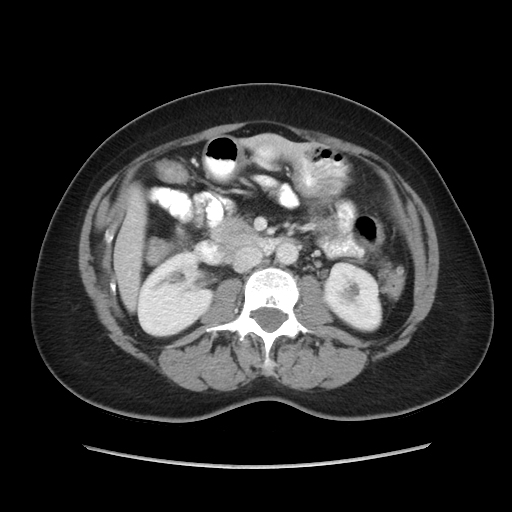
[im 44/77  lung]
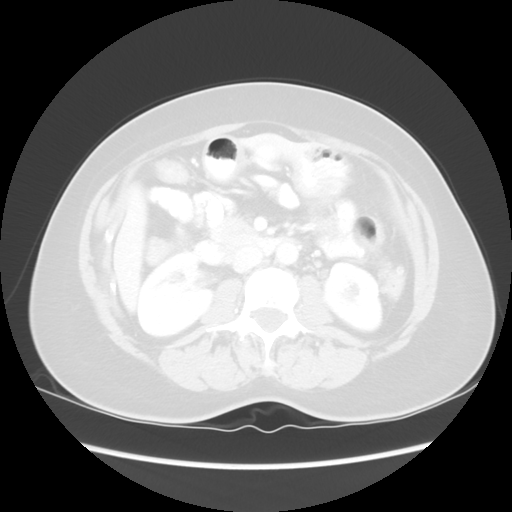
[im 55/77  soft-tissue]
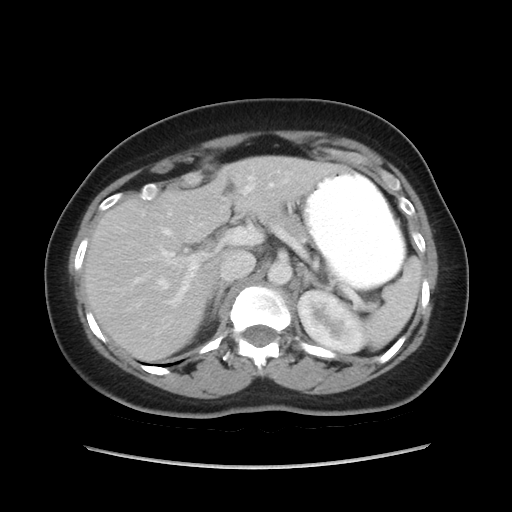
[im 55/77  lung]
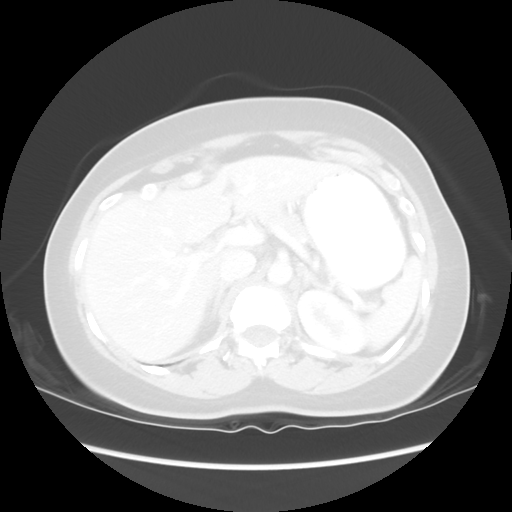
[im 66/77  soft-tissue]
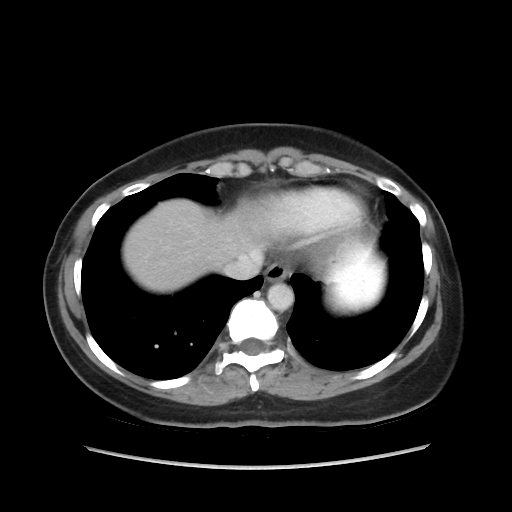
[im 66/77  lung]
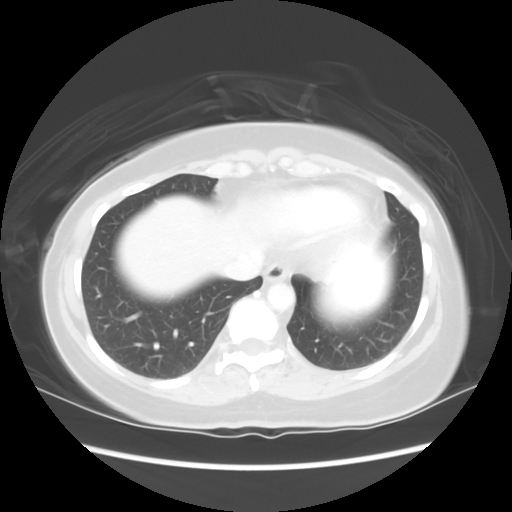

[Series 601: coronal body · coronal · 0.88mm/px · 1 of 108 slices shown, 2 images]
[im 36/108  soft-tissue]
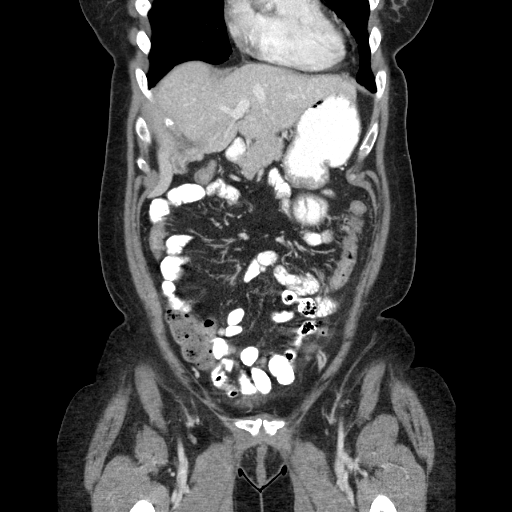
[im 36/108  bone]
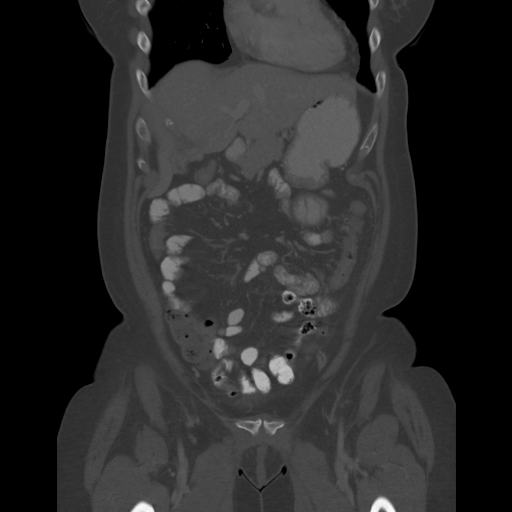

[Series 602: sagittal body · sagittal · 0.88mm/px · 6 of 145 slices shown]
[im 10/145  soft-tissue]
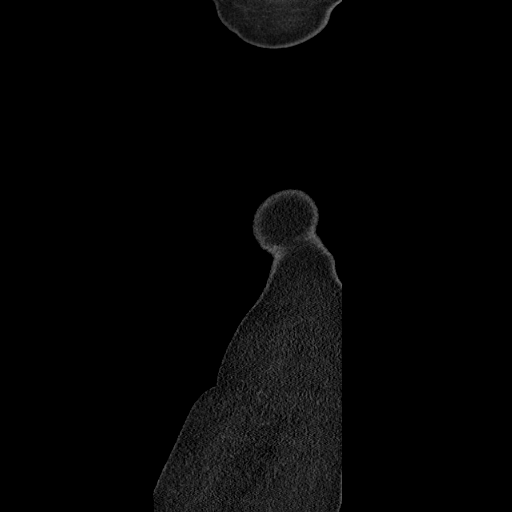
[im 28/145  soft-tissue]
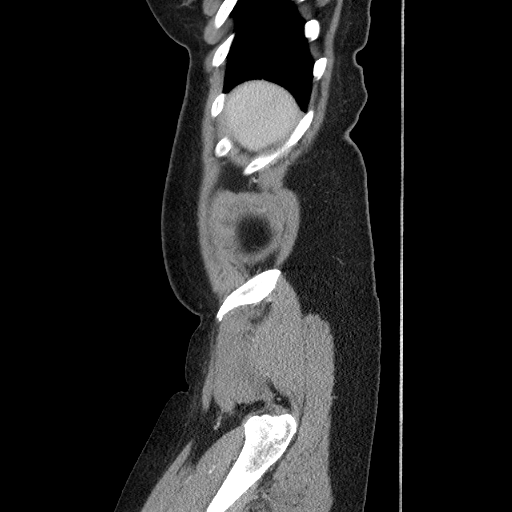
[im 46/145  soft-tissue]
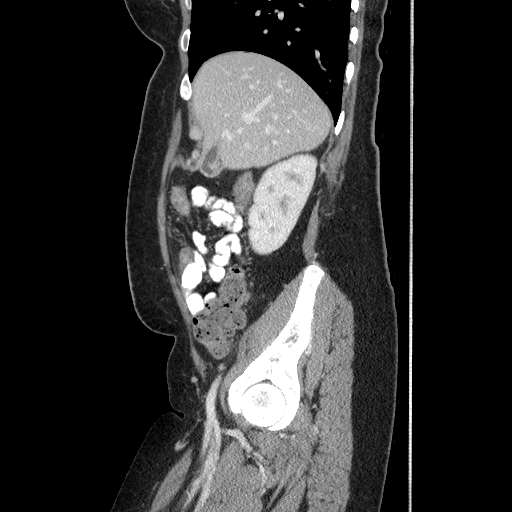
[im 64/145  soft-tissue]
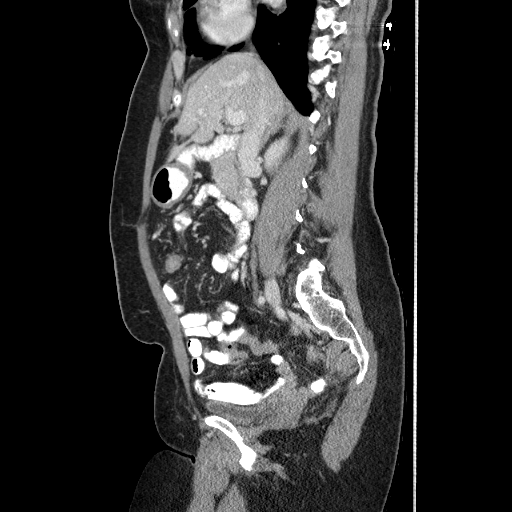
[im 82/145  soft-tissue]
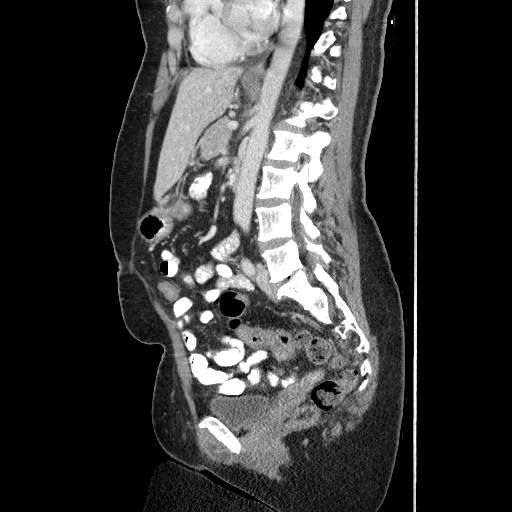
[im 100/145  soft-tissue]
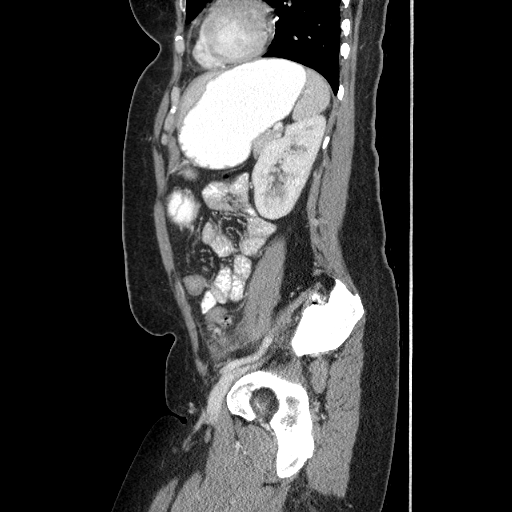

[13 of 36 positions shown; findings below may reference images not displayed]

FINDINGS: Lower chest: Lung bases are clear.

Hepatobiliary: Liver is within normal limits.

Gallbladder is unremarkable. No intrahepatic or extrahepatic duct
dilatation.

Pancreas: Within normal limits.

Spleen: Within normal limits.

Adrenals/Urinary Tract: Adrenal glands are within normal limits.

Kidneys within normal limits.  No hydronephrosis.

Bladder is within normal limits.

Stomach/Bowel: Stomach is within normal limits.

No evidence of bowel obstruction.

Normal appendix (series 4/image 59).

Left colonic diverticulosis, with associated inflammatory changes in
the left lower quadrant (series 4/image 62), reflecting mild
proximal sigmoid diverticulitis.

No drainable fluid collection/ abscess. No free air to suggest
macroscopic perforation.

Vascular/Lymphatic: No evidence of abdominal aortic aneurysm.

No suspicious abdominopelvic lymphadenopathy.

Reproductive: Retroverted uterus.

Bilateral ovaries are within normal limits.

Other: No abdominopelvic ascites.

Mild diastases of the midline anterior abdominal wall.

Musculoskeletal: Mild degenerative changes at L5-S1.
IMPRESSION: Mild proximal sigmoid diverticulitis.

No drainable fluid collection/abscess.  No free air.

## 2017-06-14 NOTE — Progress Notes (Signed)
Pre visit review using our clinic review tool, if applicable. No additional management support is needed unless otherwise documented below in the visit note. 

## 2017-06-14 NOTE — Patient Instructions (Signed)
Please go to the lab for blood work. Stay well-hydrated at get plenty of rest.  Tylenol for pain or fever.  I am glad you are feeling better but the increased tenderness on exam Is concerning.   If your WBC count is increasing, we will be sending you to the ER for IV antibiotics.
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
## 2017-06-15 ENCOUNTER — Telehealth: Payer: Self-pay | Admitting: Gastroenterology

## 2017-06-15 NOTE — Telephone Encounter (Signed)
Please advise. Thank you

## 2017-06-15 NOTE — Telephone Encounter (Signed)
That's fine with me if okay with Dr. Nandigam 

## 2017-06-15 NOTE — Telephone Encounter (Signed)
Patient states she needs to be checked for diverticulitis. Patient saw Dr.Armbruster on 04/24/17 but wants to switch to Dr.Nandigam bc she feels more comfortable with a female. Is this switch okay?

## 2017-06-18 NOTE — Telephone Encounter (Signed)
Please advise 

## 2017-07-11 NOTE — Telephone Encounter (Signed)
Sorry cannot accept transfer at this point

## 2017-07-11 NOTE — Telephone Encounter (Signed)
Please advise patient, that will need to seek a female at another practice.

## 2017-07-18 NOTE — Telephone Encounter (Signed)
Left message advising patient that Dr.Nandigam could not accept her but she was welcome to call back and sch ov with Dr.Armbruster.

## 2017-09-26 ENCOUNTER — Other Ambulatory Visit: Payer: Self-pay | Admitting: Physician Assistant

## 2017-09-27 ENCOUNTER — Encounter: Payer: Self-pay | Admitting: Emergency Medicine

## 2017-10-28 ENCOUNTER — Other Ambulatory Visit: Payer: Self-pay | Admitting: Physician Assistant

## 2017-10-29 ENCOUNTER — Encounter: Payer: Self-pay | Admitting: Emergency Medicine

## 2017-10-29 NOTE — Telephone Encounter (Signed)
Letter mailed and notified on rx patient is due for an appointment for CPE

## 2017-10-29 NOTE — Telephone Encounter (Signed)
Patient is due for a CPE. Will call to schedule.

## 2017-11-13 ENCOUNTER — Encounter: Payer: Self-pay | Admitting: Emergency Medicine

## 2017-11-15 ENCOUNTER — Encounter: Payer: Self-pay | Admitting: Emergency Medicine

## 2017-11-25 ENCOUNTER — Other Ambulatory Visit: Payer: Self-pay | Admitting: Physician Assistant

## 2017-12-10 ENCOUNTER — Other Ambulatory Visit: Payer: Self-pay | Admitting: Physician Assistant

## 2018-03-06 ENCOUNTER — Other Ambulatory Visit: Payer: Self-pay | Admitting: Obstetrics and Gynecology

## 2018-03-06 DIAGNOSIS — N644 Mastodynia: Secondary | ICD-10-CM

## 2018-04-02 ENCOUNTER — Ambulatory Visit
Admission: RE | Admit: 2018-04-02 | Discharge: 2018-04-02 | Disposition: A | Discharge status: Home / Self Care | Payer: BC Managed Care – PPO | Source: Ambulatory Visit | Attending: Obstetrics and Gynecology | Admitting: Obstetrics and Gynecology

## 2018-04-02 ENCOUNTER — Other Ambulatory Visit: Payer: BC Managed Care – PPO

## 2018-04-02 DIAGNOSIS — N644 Mastodynia: Secondary | ICD-10-CM

## 2018-06-28 ENCOUNTER — Encounter: Payer: Self-pay | Admitting: Family Medicine

## 2018-06-28 ENCOUNTER — Ambulatory Visit: Payer: BC Managed Care – PPO | Admitting: Family Medicine

## 2018-06-28 VITALS — BP 124/80 | HR 100 | Temp 98.5°F | Resp 14 | Ht 63.0 in | Wt 153.5 lb

## 2018-06-28 DIAGNOSIS — R439 Unspecified disturbances of smell and taste: Secondary | ICD-10-CM | POA: Diagnosis not present

## 2018-06-28 DIAGNOSIS — R1310 Dysphagia, unspecified: Secondary | ICD-10-CM

## 2018-06-28 DIAGNOSIS — R0683 Snoring: Secondary | ICD-10-CM

## 2018-06-28 DIAGNOSIS — R5383 Other fatigue: Secondary | ICD-10-CM | POA: Insufficient documentation

## 2018-06-28 DIAGNOSIS — F32A Depression, unspecified: Secondary | ICD-10-CM | POA: Insufficient documentation

## 2018-06-28 DIAGNOSIS — D691 Qualitative platelet defects: Secondary | ICD-10-CM

## 2018-06-28 DIAGNOSIS — Z23 Encounter for immunization: Secondary | ICD-10-CM | POA: Diagnosis not present

## 2018-06-28 DIAGNOSIS — J358 Other chronic diseases of tonsils and adenoids: Secondary | ICD-10-CM

## 2018-06-28 DIAGNOSIS — K5730 Diverticulosis of large intestine without perforation or abscess without bleeding: Secondary | ICD-10-CM

## 2018-06-28 DIAGNOSIS — Z8719 Personal history of other diseases of the digestive system: Secondary | ICD-10-CM

## 2018-06-28 DIAGNOSIS — F419 Anxiety disorder, unspecified: Secondary | ICD-10-CM

## 2018-06-28 DIAGNOSIS — E7800 Pure hypercholesterolemia, unspecified: Secondary | ICD-10-CM | POA: Insufficient documentation

## 2018-06-28 DIAGNOSIS — I10 Essential (primary) hypertension: Secondary | ICD-10-CM

## 2018-06-28 DIAGNOSIS — G4719 Other hypersomnia: Secondary | ICD-10-CM

## 2018-06-28 DIAGNOSIS — M722 Plantar fascial fibromatosis: Secondary | ICD-10-CM

## 2018-06-28 DIAGNOSIS — F329 Major depressive disorder, single episode, unspecified: Secondary | ICD-10-CM

## 2018-06-28 MED ORDER — HYDROCHLOROTHIAZIDE 12.5 MG PO TABS: 12.5000 mg | ORAL_TABLET | Freq: Every day | ORAL | 0 refills | Status: DC

## 2018-06-28 MED ORDER — OMEPRAZOLE 20 MG PO CPDR: 20.0000 mg | DELAYED_RELEASE_CAPSULE | Freq: Two times a day (BID) | ORAL | 0 refills | Status: DC

## 2018-06-28 NOTE — Progress Notes (Addendum)
Name: Desiree [name]   MRN: [id number]    DOB: [birth year]   Date:06/28/2018       Progress Note  Subjective  Chief Complaint  Chief Complaint  Patient presents with  . Establish Care  . Hypertension  . Referral    for colonoscopy and sleep study    HPI  Blood Pressure Concerns: After her son was born (2013), she started having hot flashes and palpitations, started having anxiety and high blood pressure.  She went to the ER multiple times during that time and was finally put on HCTZ in 2017.  This has worked well to treat her BP fluctuations. Her BP is normal today, but she states that her BP goes up randomly throughout the week.  She does try to follow the DASH diet. We will check labs today - she did eat an avocado and pineapple smoothie today. Denies chest pain, shortness of breath, or palpitations, no BLE edema.  Elevated LDL - has a history of slightly elevated LDL in 2018 - we will recheck today.  Denies chest pain, shortness of breath, or palpitations.  Taste Disturbance: She gets a sour taste in her mouth sometimes - worse recently.  Denies chest pain for several months, no cough. Does feel like she has to clear her throat more frequently lately - has tonsil stones and feels like her uvula is larger than normal.  Denies abdominal pain.  Diverticular Disease: She had diverticulitis flare 06/14/2018.  Had colonoscopy 6-7 years ago, but was told to return in 5 years - we will refer today.  Denies N/V/D, does notice occasional spotting from her rectum - she notes this started after having surgery to fix hemorrhoids and a fissure; endorses constipation.  Request for Sleep Study: She notes significant fatigue during the day.  She notes she snores quite loudly at night. Does not wake up gasping or choking at night.  Depression: Taking abilify and escitalopram for depression - She sees Dr. Mardene Sayer with [facility] in [place].   PHQ-9 score is 21. SI thoughts question on  PHQ-9 is positive - she states she has not had any suicidal thoughts lately - maybe 1-2 times last month only.  RIGHT heel pain - In the morning for the first hour or so.  This has been ongoing for about 6 months, she has not done anything to help the pain.  Denies weakness, numbness/tingling.  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  No longer requiring follow up at this time.  We will check CBC today.  Patient Active Problem List   Diagnosis Date Noted  . Anxiety and depression 06/28/2018  . Dysphagia 06/28/2018  . Loud snoring 06/28/2018  . Excessive daytime sleepiness 06/28/2018  . Elevated LDL cholesterol level 06/28/2018  . Fatigue 06/28/2018  . Unspecified disturbances of smell and taste 06/28/2018  . History of anal fissures 06/28/2018  . Endometriosis of uterus 06/14/2017  . 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 Hypertension 04/05/2017  . 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 Abdominal hernia 04/01/2012  . Abnormal Pap smear, low grade squamous intraepithelial lesion (LGSIL) 01/03/2012  . Lupus anticoagulant positive 04/07/2011  . Anemia complicating pregnancy in first trimester 04/07/2011  . Platelet dysfunction ([place]) 04/07/2011  . Lupus anticoagulant positive 11/07/2010    Past Surgical History:  Procedure Laterality Date  . ANAL FISSURE REPAIR  2008  . EXCISIONAL HEMORRHOIDECTOMY  5027   Bleeding complication  . HERNIA REPAIR      Family History  Problem Relation Age of Onset  . Stroke Mother        late 96s  . Hypertension Mother   . Lupus Cousin   . Breast cancer Cousin 44  . Cancer Father        Prostate  . Healthy Brother   . Hypertension Maternal Grandmother   . Heart attack Maternal Grandmother        before age 32  . Cancer Maternal Grandfather        Throat  . Hypertension Paternal Grandmother   . Cancer  Paternal Grandmother        Jaw  . Stroke Paternal Grandfather        before age 44  . Hypertension Brother   . Healthy Son   . Breast cancer Cousin 1    Social History   Socioeconomic History  . Marital status: Single    Spouse name: Not on file  . Number of children: 1  . Years of education: Not on file  . Highest education level: Not on file  Occupational History  . Occupation: Pharmacist, hospital    Comment: [employer]  . Financial resource strain: Not hard at all  . Food insecurity:    Worry: Never true    Inability: Never true  . Transportation needs:    Medical: No    Non-medical: No  Tobacco Use  . Smoking status: Never Smoker  . Smokeless tobacco: Never Used  Substance and Sexual Activity  . Alcohol use: No  . Drug use: No  . Sexual activity: Yes    Birth control/protection: Patch  Lifestyle  . Physical activity:    Days per week: 2 days    Minutes per session: 30 min  . Stress: Rather much  Relationships  . Social connections:    Talks on phone: More than three times a week    Gets together: More than three times a week    Attends religious service: More than 4 times per year    Active member of club or organization: No    Attends meetings of clubs or organizations: Never    Relationship status: Never married  . Intimate partner violence:    Fear of current or ex partner: No    Emotionally abused: No    Physically abused: No    Forced sexual activity: No  Other Topics Concern  . Not on file  Social History Narrative  . Not on file     Current Outpatient Medications:  .  ARIPiprazole (ABILIFY) 5 MG tablet, Take 5 mg by mouth daily., Disp: , Rfl:  .  escitalopram (LEXAPRO) 20 MG tablet, Take 20 mg by mouth daily., Disp: , Rfl:  .  hydrochlorothiazide (HYDRODIURIL) 12.5 MG tablet, Take 1 tablet (12.5 mg total) by mouth daily., Disp: 30 tablet, Rfl: 0 .  estradiol (VIVELLE-DOT) 0.05 MG/24HR patch, Place 1 patch onto the skin 2 (two) times a  week. Wed and Saturday., Disp: , Rfl:  .  omeprazole (PRILOSEC) 20 MG capsule, Take 1 capsule (20 mg total) by mouth 2 (two) times daily before a meal., Disp: 30 capsule, Rfl: 0  Allergies  Allergen Reactions  . Biotin Hives and Palpitations    I personally reviewed active problem list, medication list, allergies, family history, social history with the patient/caregiver today.   ROS  Ten systems reviewed and is negative except as  mentioned in HPI  Objective  Vitals:   06/28/18 0814  BP: 124/80  Pulse: 100  Resp: 14  Temp: 98.5 F (36.9 C)  TempSrc: Oral  SpO2: 98%  Weight: 153 lb 8 oz (69.6 kg)  Height: 5\' 3"  (1.6 m)   Body mass index is 27.19 kg/m.  Physical Exam Constitutional: Patient appears well-developed and well-nourished. No distress.  HENT: Head: Normocephalic and atraumatic. Ears: bilateral TMs with no erythema or effusion; Nose: Nose normal. Mouth/Throat: Oropharynx is clear and moist. No oropharyngeal exudate or tonsillar swelling.  Eyes: Conjunctivae and EOM are normal. No scleral icterus.  Pupils are equal, round, and reactive to light.  Neck: Normal range of motion. Neck supple. No JVD present. No thyromegaly present.  Cardiovascular: Normal rate, regular rhythm and normal heart sounds.  No murmur heard. No BLE edema. Pulmonary/Chest: Effort normal and breath sounds normal. No respiratory distress. Abdominal: Soft. Bowel sounds are normal, no distension. There is no tenderness. No masses. Musculoskeletal: Normal range of motion, no joint effusions. No gross deformities Neurological: Pt is alert and oriented to person, place, and time. No cranial nerve deficit. Coordination, balance, strength, speech and gait are normal.  Skin: Skin is warm and dry. No rash noted. No erythema.  Psychiatric: Patient has a normal mood and affect. behavior is normal. Judgment and thought content normal.  No results found for this or any previous visit (from the past 72  hour(s)).  PHQ2/9: Depression screen [facility] 2/9 06/28/2018 04/18/2017 11/21/2016  Decreased Interest 1 0 0  Down, Depressed, Hopeless 1 0 1  PHQ - 2 Score 2 0 1  Altered sleeping 3 0 1  Tired, decreased energy 3 0 1  Change in appetite 3 0 0  Feeling bad or failure about yourself  3 0 0  Trouble concentrating 3 0 0  Moving slowly or fidgety/restless 3 0 0  Suicidal thoughts 1 0 0  PHQ-9 Score 21 0 3  Difficult doing work/chores Somewhat difficult - -   Fall Risk: Fall Risk  06/28/2018 11/21/2016  Falls in the past year? No No    Assessment & Plan  1. Essential hypertension - DASH diet discussed - COMPLETE METABOLIC PANEL WITH GFR  2. Elevated LDL cholesterol level - Lipid panel  3. Fatigue, unspecified type - COMPLETE METABOLIC PANEL WITH GFR - Vitamin D (25 hydroxy) - Vitamin B12 - CBC w/Diff/Platelet - Ambulatory referral to Sleep Studies - TSH  4. Unspecified disturbances of smell and taste - Ambulatory referral to Gastroenterology - Ambulatory referral to ENT - omeprazole (PRILOSEC) 20 MG capsule; Take 1 capsule (20 mg total) by mouth 2 (two) times daily before a meal.  Dispense: 30 capsule; Refill: 0 - Advised possible GERD, however with taste change combined with dysphagia we will refer to GI.  She also requests referral to ENT - I recommend one referral at a time for evaluation, but she prefers to see ENT due to her history of tonsil stones as well.  Both referrals are placed.  5. Diverticular disease of colon - Ambulatory referral to Gastroenterology  6. Dysphagia, unspecified type - Ambulatory referral to Gastroenterology - Ambulatory referral to ENT - omeprazole (PRILOSEC) 20 MG capsule; Take 1 capsule (20 mg total) by mouth 2 (two) times daily before a meal.  Dispense: 30 capsule; Refill: 0  7. Tonsil stone - Ambulatory referral to ENT  8. Loud snoring - Ambulatory referral to Sleep Studies  9. Excessive daytime sleepiness - Ambulatory referral to Sleep  Studies  10. Anxiety and depression - Maintain follow up with Psychiatry and counseling  11. History of anal fissures - Ambulatory referral to Gastroenterology  12. Plantar fasciitis of right foot - Discussed gentle stretching and rolling frozen water bottle at least twice daily.  13. Platelet dysfunction (HCC) - CBC  14. Need for influenza vaccination - Flu Vaccine QUAD 6+ mos PF IM (Fluarix Quad PF)

## 2018-06-28 NOTE — Patient Instructions (Addendum)
DASH Eating Plan DASH stands for "Dietary Approaches to Stop Hypertension." The DASH eating plan is a healthy eating plan that has been shown to reduce high blood pressure (hypertension). It may also reduce your risk for type 2 diabetes, heart disease, and stroke. The DASH eating plan may also help with weight loss. What are tips for following this plan? General guidelines  Avoid eating more than 2,300 mg (milligrams) of salt (sodium) a day. If you have hypertension, you may need to reduce your sodium intake to 1,500 mg a day.  Limit alcohol intake to no more than 1 drink a day for nonpregnant women and 2 drinks a day for men. One drink equals 12 oz of beer, 5 oz of wine, or 1 oz of hard liquor.  Work with your health care provider to maintain a healthy body weight or to lose weight. Ask what an ideal weight is for you.  Get at least 30 minutes of exercise that causes your heart to beat faster (aerobic exercise) most days of the week. Activities may include walking, swimming, or biking.  Work with your health care provider or diet and nutrition specialist (dietitian) to adjust your eating plan to your individual calorie needs. Reading food labels  Check food labels for the amount of sodium per serving. Choose foods with less than 5 percent of the Daily Value of sodium. Generally, foods with less than 300 mg of sodium per serving fit into this eating plan.  To find whole grains, look for the word "whole" as the first word in the ingredient list. Shopping  Buy products labeled as "low-sodium" or "no salt added."  Buy fresh foods. Avoid canned foods and premade or frozen meals. Cooking  Avoid adding salt when cooking. Use salt-free seasonings or herbs instead of table salt or sea salt. Check with your health care provider or pharmacist before using salt substitutes.  Do not fry foods. Cook foods using healthy methods such as baking, boiling, grilling, and broiling instead.  Cook with  heart-healthy oils, such as olive, canola, soybean, or sunflower oil. Meal planning   Eat a balanced diet that includes: ? 5 or more servings of fruits and vegetables each day. At each meal, try to fill half of your plate with fruits and vegetables. ? Up to 6-8 servings of whole grains each day. ? Less than 6 oz of lean meat, poultry, or fish each day. A 3-oz serving of meat is about the same size as a deck of cards. One egg equals 1 oz. ? 2 servings of low-fat dairy each day. ? A serving of nuts, seeds, or beans 5 times each week. ? Heart-healthy fats. Healthy fats called Omega-3 fatty acids are found in foods such as flaxseeds and coldwater fish, like sardines, salmon, and mackerel.  Limit how much you eat of the following: ? Canned or prepackaged foods. ? Food that is high in trans fat, such as fried foods. ? Food that is high in saturated fat, such as fatty meat. ? Sweets, desserts, sugary drinks, and other foods with added sugar. ? Full-fat dairy products.  Do not salt foods before eating.  Try to eat at least 2 vegetarian meals each week.  Eat more home-cooked food and less restaurant, buffet, and fast food.  When eating at a restaurant, ask that your food be prepared with less salt or no salt, if possible. What foods are recommended? The items listed may not be a complete list. Talk with your dietitian about what   dietary choices are best for you. Grains Whole-grain or whole-wheat bread. Whole-grain or whole-wheat pasta. Brown rice. Oatmeal. Quinoa. Bulgur. Whole-grain and low-sodium cereals. Pita bread. Low-fat, low-sodium crackers. Whole-wheat flour tortillas. Vegetables Fresh or frozen vegetables (raw, steamed, roasted, or grilled). Low-sodium or reduced-sodium tomato and vegetable juice. Low-sodium or reduced-sodium tomato sauce and tomato paste. Low-sodium or reduced-sodium canned vegetables. Fruits All fresh, dried, or frozen fruit. Canned fruit in natural juice (without  added sugar). Meat and other protein foods Skinless chicken or turkey. Ground chicken or turkey. Pork with fat trimmed off. Fish and seafood. Egg whites. Dried beans, peas, or lentils. Unsalted nuts, nut butters, and seeds. Unsalted canned beans. Lean cuts of beef with fat trimmed off. Low-sodium, lean deli meat. Dairy Low-fat (1%) or fat-free (skim) milk. Fat-free, low-fat, or reduced-fat cheeses. Nonfat, low-sodium ricotta or cottage cheese. Low-fat or nonfat yogurt. Low-fat, low-sodium cheese. Fats and oils Soft margarine without trans fats. Vegetable oil. Low-fat, reduced-fat, or light mayonnaise and salad dressings (reduced-sodium). Canola, safflower, olive, soybean, and sunflower oils. Avocado. Seasoning and other foods Herbs. Spices. Seasoning mixes without salt. Unsalted popcorn and pretzels. Fat-free sweets. What foods are not recommended? The items listed may not be a complete list. Talk with your dietitian about what dietary choices are best for you. Grains Baked goods made with fat, such as croissants, muffins, or some breads. Dry pasta or rice meal packs. Vegetables Creamed or fried vegetables. Vegetables in a cheese sauce. Regular canned vegetables (not low-sodium or reduced-sodium). Regular canned tomato sauce and paste (not low-sodium or reduced-sodium). Regular tomato and vegetable juice (not low-sodium or reduced-sodium). Pickles. Olives. Fruits Canned fruit in a light or heavy syrup. Fried fruit. Fruit in cream or butter sauce. Meat and other protein foods Fatty cuts of meat. Ribs. Fried meat. Bacon. Sausage. Bologna and other processed lunch meats. Salami. Fatback. Hotdogs. Bratwurst. Salted nuts and seeds. Canned beans with added salt. Canned or smoked fish. Whole eggs or egg yolks. Chicken or turkey with skin. Dairy Whole or 2% milk, cream, and half-and-half. Whole or full-fat cream cheese. Whole-fat or sweetened yogurt. Full-fat cheese. Nondairy creamers. Whipped toppings.  Processed cheese and cheese spreads. Fats and oils Butter. Stick margarine. Lard. Shortening. Ghee. Bacon fat. Tropical oils, such as coconut, palm kernel, or palm oil. Seasoning and other foods Salted popcorn and pretzels. Onion salt, garlic salt, seasoned salt, table salt, and sea salt. Worcestershire sauce. Tartar sauce. Barbecue sauce. Teriyaki sauce. Soy sauce, including reduced-sodium. Steak sauce. Canned and packaged gravies. Fish sauce. Oyster sauce. Cocktail sauce. Horseradish that you find on the shelf. Ketchup. Mustard. Meat flavorings and tenderizers. Bouillon cubes. Hot sauce and Tabasco sauce. Premade or packaged marinades. Premade or packaged taco seasonings. Relishes. Regular salad dressings. Where to find more information:  National Heart, Lung, and Blood Institute: www.nhlbi.nih.gov  American Heart Association: www.heart.org Summary  The DASH eating plan is a healthy eating plan that has been shown to reduce high blood pressure (hypertension). It may also reduce your risk for type 2 diabetes, heart disease, and stroke.  With the DASH eating plan, you should limit salt (sodium) intake to 2,300 mg a day. If you have hypertension, you may need to reduce your sodium intake to 1,500 mg a day.  When on the DASH eating plan, aim to eat more fresh fruits and vegetables, whole grains, lean proteins, low-fat dairy, and heart-healthy fats.  Work with your health care provider or diet and nutrition specialist (dietitian) to adjust your eating plan to your individual   calorie needs. This information is not intended to replace advice given to you by your health care provider. Make sure you discuss any questions you have with your health care provider. Document Released: 08/31/2011 Document Revised: 09/04/2016 Document Reviewed: 09/04/2016 Elsevier Interactive Patient Education  2018 [place].   Plantar Fasciitis Rehab Ask your health care provider which exercises are safe for you. Do  exercises exactly as told by your health care provider and adjust them as directed. It is normal to feel mild stretching, pulling, tightness, or discomfort as you do these exercises, but you should stop right away if you feel sudden pain or your pain gets worse. Do not begin these exercises until told by your health care provider. Stretching and range of motion exercises These exercises warm up your muscles and joints and improve the movement and flexibility of your foot. These exercises also help to relieve pain. Exercise A: Plantar fascia stretch  1. Sit with your left / right leg crossed over your opposite knee. 2. Hold your heel with one hand with that thumb near your arch. With your other hand, hold your toes and gently pull them back toward the top of your foot. You should feel a stretch on the bottom of your toes or your foot or both. 3. Hold this stretch for__________ seconds. 4. Slowly release your toes and return to the starting position. Repeat __________ times. Complete this exercise __________ times a day. Exercise B: Gastroc, standing  1. Stand with your hands against a wall. 2. Extend your left / right leg behind you, and bend your front knee slightly. 3. Keeping your heels on the floor and keeping your back knee straight, shift your weight toward the wall without arching your back. You should feel a gentle stretch in your left / right calf. 4. Hold this position for __________ seconds. Repeat __________ times. Complete this exercise __________ times a day. Exercise C: Soleus, standing 1. Stand with your hands against a wall. 2. Extend your left / right leg behind you, and bend your front knee slightly. 3. Keeping your heels on the floor, bend your back knee and slightly shift your weight over the back leg. You should feel a gentle stretch deep in your calf. 4. Hold this position for __________ seconds. Repeat __________ times. Complete this exercise __________ times a  day. Exercise D: Gastrocsoleus, standing 1. Stand with the ball of your left / right foot on a step. The ball of your foot is on the walking surface, right under your toes. 2. Keep your other foot firmly on the same step. 3. Hold onto the wall or a railing for balance. 4. Slowly lift your other foot, allowing your body weight to press your heel down over the edge of the step. You should feel a stretch in your left / right calf. 5. Hold this position for __________ seconds. 6. Return both feet to the step. 7. Repeat this exercise with a slight bend in your left / right knee. Repeat __________ times with your left / right knee straight and __________ times with your left / right knee bent. Complete this exercise __________ times a day. Balance exercise This exercise builds your balance and strength control of your arch to help take pressure off your plantar fascia. Exercise E: Single leg stand 1. Without shoes, stand near a railing or in a doorway. You may hold onto the railing or door frame as needed. 2. Stand on your left / right foot. Keep your big  toe down on the floor and try to keep your arch lifted. Do not let your foot roll inward. 3. Hold this position for __________ seconds. 4. If this exercise is too easy, you can try it with your eyes closed or while standing on a pillow. Repeat __________ times. Complete this exercise __________ times a day. This information is not intended to replace advice given to you by your health care provider. Make sure you discuss any questions you have with your health care provider. Document Released: 09/11/2005 Document Revised: 05/16/2016 Document Reviewed: 07/26/2015 Elsevier Interactive Patient Education  2018 [employer].

## 2018-06-29 ENCOUNTER — Other Ambulatory Visit: Payer: Self-pay | Admitting: Family Medicine

## 2018-06-29 DIAGNOSIS — R760 Raised antibody titer: Secondary | ICD-10-CM

## 2018-06-29 DIAGNOSIS — D691 Qualitative platelet defects: Secondary | ICD-10-CM

## 2018-06-29 DIAGNOSIS — D72819 Decreased white blood cell count, unspecified: Secondary | ICD-10-CM

## 2018-06-29 LAB — LIPID PANEL
CHOLESTEROL: 165 mg/dL (ref <200)
HDL: 58 mg/dL (ref >50)
LDL Cholesterol (Calc): 94 mg/dL (calc)
Non-HDL Cholesterol (Calc): 107 mg/dL (calc) (ref <130)
Total CHOL/HDL Ratio: 2.8 (calc) (ref <5.0)
Triglycerides: 45 mg/dL (ref <150)

## 2018-06-29 LAB — CBC WITH DIFFERENTIAL/PLATELET
BASOS PCT: 0.3 %
Basophils Absolute: 10 cells/uL (ref 0–200)
EOS ABS: 20 {cells}/uL (ref 15–500)
Eosinophils Relative: 0.6 %
HCT: 34.3 % — ABNORMAL LOW (ref 35.0–45.0)
Hemoglobin: 11.2 g/dL — ABNORMAL LOW (ref 11.7–15.5)
Lymphs Abs: 1393 cells/uL (ref 850–3900)
MCH: 25.9 pg — AB (ref 27.0–33.0)
MCHC: 32.7 g/dL (ref 32.0–36.0)
MCV: 79.2 fL — AB (ref 80.0–100.0)
MONOS PCT: 9.6 %
MPV: 10.2 fL (ref 7.5–12.5)
Neutro Abs: 1561 cells/uL (ref 1500–7800)
Neutrophils Relative %: 47.3 %
Platelets: 297 10*3/uL (ref 140–400)
RBC: 4.33 10*6/uL (ref 3.80–5.10)
RDW: 12.5 % (ref 11.0–15.0)
Total Lymphocyte: 42.2 %
WBC: 3.3 10*3/uL — ABNORMAL LOW (ref 3.8–10.8)
WBCMIX: 317 {cells}/uL (ref 200–950)

## 2018-06-29 LAB — COMPLETE METABOLIC PANEL WITH GFR
AG Ratio: 1.6 (calc) (ref 1.0–2.5)
ALBUMIN MSPROF: 4.5 g/dL (ref 3.6–5.1)
ALKALINE PHOSPHATASE (APISO): 68 U/L (ref 33–115)
ALT: 17 U/L (ref 6–29)
AST: 21 U/L (ref 10–35)
BILIRUBIN TOTAL: 0.5 mg/dL (ref 0.2–1.2)
BUN / CREAT RATIO: 8 (calc) (ref 6–22)
BUN: 6 mg/dL — ABNORMAL LOW (ref 7–25)
CHLORIDE: 103 mmol/L (ref 98–110)
CO2: 30 mmol/L (ref 20–32)
Calcium: 9.6 mg/dL (ref 8.6–10.2)
Creat: 0.79 mg/dL (ref 0.50–1.10)
GFR, Est African American: 103 mL/min/{1.73_m2} (ref >=60)
GFR, Est Non African American: 89 mL/min/{1.73_m2} (ref >=60)
GLOBULIN: 2.8 g/dL (ref 1.9–3.7)
Glucose, Bld: 87 mg/dL (ref 65–139)
Potassium: 3.7 mmol/L (ref 3.5–5.3)
SODIUM: 141 mmol/L (ref 135–146)
Total Protein: 7.3 g/dL (ref 6.1–8.1)

## 2018-06-29 LAB — VITAMIN B12: Vitamin B-12: 430 pg/mL (ref 200–1100)

## 2018-06-29 LAB — VITAMIN D 25 HYDROXY (VIT D DEFICIENCY, FRACTURES): VIT D 25 HYDROXY: 22 ng/mL — AB (ref 30–100)

## 2018-06-29 LAB — TSH: TSH: 0.94 m[IU]/L

## 2018-07-04 ENCOUNTER — Other Ambulatory Visit: Payer: Self-pay

## 2018-07-04 ENCOUNTER — Encounter: Payer: Self-pay | Admitting: Gastroenterology

## 2018-07-04 ENCOUNTER — Ambulatory Visit: Payer: BC Managed Care – PPO | Admitting: Gastroenterology

## 2018-07-04 VITALS — BP 106/68 | HR 67 | Ht 63.0 in | Wt 159.2 lb

## 2018-07-04 DIAGNOSIS — K5909 Other constipation: Secondary | ICD-10-CM | POA: Diagnosis not present

## 2018-07-04 DIAGNOSIS — Z1211 Encounter for screening for malignant neoplasm of colon: Secondary | ICD-10-CM

## 2018-07-04 MED ORDER — NA SULFATE-K SULFATE-MG SULF 17.5-3.13-1.6 GM/177ML PO SOLN: 1.0000 | ORAL | 0 refills | Status: DC

## 2018-07-04 NOTE — Patient Instructions (Signed)
High-Fiber Diet  Fiber, also called dietary fiber, is a type of carbohydrate found in fruits, vegetables, whole grains, and beans. A high-fiber diet can have many health benefits. Your health care provider may recommend a high-fiber diet to help:  · Prevent constipation. Fiber can make your bowel movements more regular.  · Lower your cholesterol.  · Relieve hemorrhoids, uncomplicated diverticulosis, or irritable bowel syndrome.  · Prevent overeating as part of a weight-loss plan.  · Prevent heart disease, type 2 diabetes, and certain cancers.    What is my plan?  The recommended daily intake of fiber includes:  · 38 grams for men under age 50.  · 30 grams for men over age 50.  · 25 grams for women under age 50.  · 21 grams for women over age 50.    You can get the recommended daily intake of dietary fiber by eating a variety of fruits, vegetables, grains, and beans. Your health care provider may also recommend a fiber supplement if it is not possible to get enough fiber through your diet.  What do I need to know about a high-fiber diet?  · Fiber supplements have not been widely studied for their effectiveness, so it is better to get fiber through food sources.  · Always check the fiber content on the nutrition facts label of any prepackaged food. Look for foods that contain at least 5 grams of fiber per serving.  · Ask your dietitian if you have questions about specific foods that are related to your condition, especially if those foods are not listed in the following section.  · Increase your daily fiber consumption gradually. Increasing your intake of dietary fiber too quickly may cause bloating, cramping, or gas.  · Drink plenty of water. Water helps you to digest fiber.  What foods can I eat?  Grains  Whole-grain breads. Multigrain cereal. Oats and oatmeal. Brown rice. Barley. Bulgur wheat. Millet. Bran muffins. Popcorn. Rye wafer crackers.  Vegetables   Sweet potatoes. Spinach. Kale. Artichokes. Cabbage. Broccoli. Green peas. Carrots. Squash.  Fruits  Berries. Pears. Apples. Oranges. Avocados. Prunes and raisins. Dried figs.  Meats and Other Protein Sources  Navy, kidney, pinto, and soy beans. Split peas. Lentils. Nuts and seeds.  Dairy  Fiber-fortified yogurt.  Beverages  Fiber-fortified soy milk. Fiber-fortified orange juice.  Other  Fiber bars.  The items listed above may not be a complete list of recommended foods or beverages. Contact your dietitian for more options.  What foods are not recommended?  Grains  White bread. Pasta made with refined flour. White rice.  Vegetables  Fried potatoes. Canned vegetables. Well-cooked vegetables.  Fruits  Fruit juice. Cooked, strained fruit.  Meats and Other Protein Sources  Fatty cuts of meat. Fried poultry or fried fish.  Dairy  Milk. Yogurt. Cream cheese. Sour cream.  Beverages  Soft drinks.  Other  Cakes and pastries. Butter and oils.  The items listed above may not be a complete list of foods and beverages to avoid. Contact your dietitian for more information.  What are some tips for including high-fiber foods in my diet?  · Eat a wide variety of high-fiber foods.  · Make sure that half of all grains consumed each day are whole grains.  · Replace breads and cereals made from refined flour or white flour with whole-grain breads and cereals.  · Replace white rice with brown rice, bulgur wheat, or millet.  · Start the day with a breakfast that is high in fiber,   such as a cereal that contains at least 5 grams of fiber per serving.  · Use beans in place of meat in soups, salads, or pasta.  · Eat high-fiber snacks, such as berries, raw vegetables, nuts, or popcorn.  This information is not intended to replace advice given to you by your health care provider. Make sure you discuss any questions you have with your health care provider.  Document Released: 09/11/2005 Document Revised: 02/17/2016 Document Reviewed: 02/24/2014   Elsevier Interactive Patient Education © 2018 [employer]
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
## 2018-07-11 ENCOUNTER — Ambulatory Visit: Payer: BC Managed Care – PPO

## 2018-07-11 ENCOUNTER — Encounter
Admission: RE | Disposition: A | Discharge status: Home / Self Care | Payer: Self-pay | Source: Ambulatory Visit | Attending: Gastroenterology

## 2018-07-11 ENCOUNTER — Ambulatory Visit
Admission: RE | Admit: 2018-07-11 | Discharge: 2018-07-11 | Disposition: A | Discharge status: Home / Self Care | Payer: BC Managed Care – PPO | Source: Ambulatory Visit | Attending: Gastroenterology | Admitting: Gastroenterology

## 2018-07-11 ENCOUNTER — Ambulatory Visit: Payer: BC Managed Care – PPO | Admitting: Anesthesiology

## 2018-07-11 DIAGNOSIS — Z79890 Hormone replacement therapy (postmenopausal): Secondary | ICD-10-CM | POA: Diagnosis not present

## 2018-07-11 DIAGNOSIS — F329 Major depressive disorder, single episode, unspecified: Secondary | ICD-10-CM | POA: Insufficient documentation

## 2018-07-11 DIAGNOSIS — Z9889 Other specified postprocedural states: Secondary | ICD-10-CM | POA: Diagnosis not present

## 2018-07-11 DIAGNOSIS — Z79899 Other long term (current) drug therapy: Secondary | ICD-10-CM | POA: Diagnosis not present

## 2018-07-11 DIAGNOSIS — N83202 Unspecified ovarian cyst, left side: Secondary | ICD-10-CM | POA: Diagnosis not present

## 2018-07-11 DIAGNOSIS — K5730 Diverticulosis of large intestine without perforation or abscess without bleeding: Secondary | ICD-10-CM | POA: Insufficient documentation

## 2018-07-11 DIAGNOSIS — K644 Residual hemorrhoidal skin tags: Secondary | ICD-10-CM | POA: Insufficient documentation

## 2018-07-11 DIAGNOSIS — I10 Essential (primary) hypertension: Secondary | ICD-10-CM | POA: Insufficient documentation

## 2018-07-11 DIAGNOSIS — Z8249 Family history of ischemic heart disease and other diseases of the circulatory system: Secondary | ICD-10-CM | POA: Insufficient documentation

## 2018-07-11 DIAGNOSIS — Z888 Allergy status to other drugs, medicaments and biological substances status: Secondary | ICD-10-CM | POA: Insufficient documentation

## 2018-07-11 DIAGNOSIS — K6289 Other specified diseases of anus and rectum: Secondary | ICD-10-CM | POA: Insufficient documentation

## 2018-07-11 DIAGNOSIS — D6862 Lupus anticoagulant syndrome: Secondary | ICD-10-CM | POA: Diagnosis not present

## 2018-07-11 DIAGNOSIS — Z823 Family history of stroke: Secondary | ICD-10-CM | POA: Insufficient documentation

## 2018-07-11 DIAGNOSIS — Z1211 Encounter for screening for malignant neoplasm of colon: Secondary | ICD-10-CM | POA: Insufficient documentation

## 2018-07-11 HISTORY — PX: COLONOSCOPY WITH PROPOFOL: SHX5780

## 2018-07-11 SURGERY — COLONOSCOPY WITH PROPOFOL
Anesthesia: General

## 2018-07-11 MED ORDER — PROPOFOL 500 MG/50ML IV EMUL
INTRAVENOUS | Status: AC
  Filled 2018-07-11: qty 50

## 2018-07-11 MED ORDER — PROPOFOL 500 MG/50ML IV EMUL
INTRAVENOUS | Status: DC | PRN
  Administered 2018-07-11: 140 ug/kg/min via INTRAVENOUS

## 2018-07-11 MED ORDER — PROPOFOL 10 MG/ML IV BOLUS
INTRAVENOUS | Status: AC
  Filled 2018-07-11: qty 20

## 2018-07-11 MED ORDER — SODIUM CHLORIDE 0.9 % IV SOLN
INTRAVENOUS | Status: DC
  Administered 2018-07-11: 1000 mL via INTRAVENOUS

## 2018-07-11 MED ORDER — PROPOFOL 10 MG/ML IV BOLUS
INTRAVENOUS | Status: DC | PRN
  Administered 2018-07-11: 20 mg via INTRAVENOUS
  Administered 2018-07-11: 50 mg via INTRAVENOUS
  Administered 2018-07-11: 80 mg via INTRAVENOUS
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
## 2018-07-11 NOTE — Anesthesia Postprocedure Evaluation (Signed)
Anesthesia Post Note  Patient: Artemis Koller  Procedure(s) Performed: COLONOSCOPY WITH PROPOFOL (N/A )  Patient location during evaluation: Endoscopy Anesthesia Type: General Level of consciousness: awake and alert Pain management: pain level controlled Vital Signs Assessment: post-procedure vital signs reviewed and stable Respiratory status: spontaneous breathing, nonlabored ventilation, respiratory function stable and patient connected to nasal cannula oxygen Cardiovascular status: blood pressure returned to baseline and stable Postop Assessment: no apparent nausea or vomiting Anesthetic complications: no     Last Vitals:  Vitals:   07/11/18 0926 07/11/18 0936  BP: (!) 125/92 130/87  Pulse: 80 67  Resp: 13 15  Temp:    SpO2: 100% 100%    Last Pain:  Vitals:   07/11/18 0936  TempSrc:   PainSc: 0-No pain                 Precious Haws Nakeeta Sebastiani

## 2018-07-11 NOTE — Anesthesia Post-op Follow-up Note (Signed)
Anesthesia QCDR form completed.        

## 2018-07-11 NOTE — Op Note (Signed)
[facility] Gastroenterology Patient Name: Desiree [name] Procedure Date: 07/11/2018 8:06 AM MRN: [id number] Account #: [id number] Date of Birth: 04/30/1971 Admit Type: Outpatient Age: 47 Room: [facility] ENDO ROOM 2 Gender: Female Note Status: Finalized Procedure:            Colonoscopy Indications:          Screening for colorectal malignant neoplasm Providers:            Lin Landsman MD, MD Referring MD:         No Local Md, MD (Referring MD) Medicines:            Monitored Anesthesia Care Complications:        No immediate complications. Estimated blood loss: None. Procedure:            Pre-Anesthesia Assessment:                       - Prior to the procedure, a History and Physical was                        performed, and patient medications and allergies were                        reviewed. The patient is competent. The risks and                        benefits of the procedure and the sedation options and                        risks were discussed with the patient. All questions                        were answered and informed consent was obtained.                        Patient identification and proposed procedure were                        verified by the physician, the nurse, the                        anesthesiologist, the anesthetist and the technician in                        the pre-procedure area in the procedure room in the                        endoscopy suite. Mental Status Examination: alert and                        oriented. Airway Examination: normal oropharyngeal                        airway and neck mobility. Respiratory Examination:                        clear to auscultation. CV Examination: normal.                        Prophylactic Antibiotics: The patient does not require  prophylactic antibiotics. Prior Anticoagulants: The                        patient has taken no previous anticoagulant or              antiplatelet agents. ASA Grade Assessment: III - A                        patient with severe systemic disease. After reviewing                        the risks and benefits, the patient was deemed in                        satisfactory condition to undergo the procedure. The                        anesthesia plan was to use monitored anesthesia care                        (MAC). Immediately prior to administration of                        medications, the patient was re-assessed for adequacy                        to receive sedatives. The heart rate, respiratory rate,                        oxygen saturations, blood pressure, adequacy of                        pulmonary ventilation, and response to care were                        monitored throughout the procedure. The physical status                        of the patient was re-assessed after the procedure.                       After obtaining informed consent, the colonoscope was                        passed under direct vision. Throughout the procedure,                        the patient's blood pressure, pulse, and oxygen                        saturations were monitored continuously. The                        Colonoscope was introduced through the anus and                        advanced to the the terminal ileum. The colonoscopy was                        unusually difficult due to multiple diverticula in the  colon. Successful completion of the procedure was aided                        by increasing the dose of sedation medication and                        applying abdominal pressure. The patient tolerated the                        procedure well. The quality of the bowel preparation                        was evaluated using the BBPS [facility] Bowel Preparation                        Scale) with scores of: Right Colon = 3, Transverse                        Colon = 3 and Left Colon = 3 (entire  mucosa seen well                        with no residual staining, small fragments of stool or                        opaque liquid). The total BBPS score equals 9. Findings:      Skin tags were found on perianal exam.      The terminal ileum appeared normal.      Multiple diverticula were found in the recto-sigmoid colon and sigmoid       colon. There was narrowing of the colon in association with the       diverticular opening. There was no evidence of diverticular bleeding.      A diffuse area of mildly erythematous mucosa was found in the distal       rectum. Biopsies were taken with a cold forceps for histology.      There was evidence of a prior hemorrhoidectomy, identified by sutures       anastomosis in the distal rectum. This was characterized by healthy       appearing mucosa.      The exam was otherwise without abnormality. Impression:           - Perianal skin tags found on perianal exam.                       - The examined portion of the ileum was normal.                       - Severe diverticulosis in the recto-sigmoid colon and                        in the sigmoid colon. There was narrowing of the colon                        in association with the diverticular opening. There was                        no evidence of diverticular bleeding.                       -  Erythematous mucosa in the distal rectum. Biopsied.                       - Hemorrhoidectomy, identified by sutures anastomosis,                        characterized by healthy appearing mucosa.                       - The examination was otherwise normal. Recommendation:       - Discharge patient to home (with escort).                       - Resume previous diet today.                       - Continue present medications.                       - Await pathology results.                       - Repeat colonoscopy in 10 years for surveillance.                       - Return to my office as previously  scheduled. Procedure Code(s):    --- Professional ---                       [phone], Colonoscopy, flexible; with biopsy, single or                        multiple Diagnosis Code(s):    --- Professional ---                       Z12.11, Encounter for screening for malignant neoplasm                        of colon                       K62.89, Other specified diseases of anus and rectum                       Z98.0, Intestinal bypass and anastomosis status                       K64.4, Residual hemorrhoidal skin tags                       K57.30, Diverticulosis of large intestine without                        perforation or abscess without bleeding CPT copyright 2018 American Medical Association. All rights reserved. The codes documented in this report are preliminary and upon coder review may  be revised to meet current compliance requirements. Dr. Ulyess Mort Lin Landsman MD, MD 07/11/2018 9:07:27 AM This report has been signed electronically. Number of Addenda: 0 Note Initiated On: 07/11/2018 8:06 AM Scope Withdrawal Time: 0 hours 14 minutes 3 seconds  Total Procedure Duration: 0 hours 22 minutes 51 seconds       [facility]

## 2018-07-11 NOTE — Transfer of Care (Signed)
Immediate Anesthesia Transfer of Care Note  Patient: Desiree [name]  Procedure(s) Performed: COLONOSCOPY WITH PROPOFOL (N/A )  Patient Location: Endoscopy Unit  Anesthesia Type:General  Level of Consciousness: sedated  Airway & Oxygen Therapy: Patient Spontanous Breathing and Patient connected to nasal cannula oxygen  Post-op Assessment: Report given to RN and Post -op Vital signs reviewed and stable  Post vital signs: Reviewed and stable  Last Vitals:  Vitals Value Taken Time  BP    Temp 36.2 C 07/11/2018  9:06 AM  Pulse    Resp    SpO2 100 % 07/11/2018  9:06 AM    Last Pain:  Vitals:   07/11/18 0906  TempSrc: Tympanic  PainSc:          Complications: No apparent anesthesia complications
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
## 2018-07-13 LAB — SURGICAL PATHOLOGY

## 2018-07-24 ENCOUNTER — Other Ambulatory Visit: Payer: Self-pay | Admitting: Family Medicine

## 2018-07-29 ENCOUNTER — Encounter: Payer: Self-pay | Admitting: Family Medicine

## 2018-08-01 ENCOUNTER — Encounter

## 2018-08-01 ENCOUNTER — Ambulatory Visit: Payer: BC Managed Care – PPO | Admitting: Gastroenterology

## 2018-08-09 ENCOUNTER — Encounter: Payer: Self-pay | Admitting: Family Medicine

## 2018-08-11 NOTE — Telephone Encounter (Signed)
Have we received a fax about Desiree [name]'s sleep study results? Thank you!

## 2018-08-20 ENCOUNTER — Telehealth: Payer: Self-pay | Admitting: Emergency Medicine

## 2018-08-20 DIAGNOSIS — G4730 Sleep apnea, unspecified: Secondary | ICD-10-CM

## 2018-08-20 NOTE — Telephone Encounter (Signed)
Please look at 10/17 study under Media for results

## 2018-08-20 NOTE — Telephone Encounter (Signed)
Recommendation is for Sleep study w/ CPAP titration, will refer to pulmonology for further evaluation

## 2018-08-20 NOTE — Telephone Encounter (Signed)
Copied from [place] [id number]. Topic: General - Other >> Aug 16, 2018 12:29 PM Rayann Heman wrote: Reason for CRM: pt calling to check status of sleep study results. Please advise

## 2018-08-28 DIAGNOSIS — R791 Abnormal coagulation profile: Secondary | ICD-10-CM | POA: Insufficient documentation

## 2018-08-28 NOTE — Telephone Encounter (Signed)
Sent patient e mail

## 2018-09-02 ENCOUNTER — Institutional Professional Consult (permissible substitution): Payer: BC Managed Care – PPO | Admitting: Internal Medicine

## 2018-09-03 ENCOUNTER — Ambulatory Visit (INDEPENDENT_AMBULATORY_CARE_PROVIDER_SITE_OTHER): Payer: BC Managed Care – PPO | Admitting: Internal Medicine

## 2018-09-03 ENCOUNTER — Encounter: Payer: Self-pay | Admitting: Internal Medicine

## 2018-09-03 VITALS — BP 102/70 | HR 100 | Resp 16 | Ht 63.0 in | Wt 163.0 lb

## 2018-09-03 DIAGNOSIS — G4733 Obstructive sleep apnea (adult) (pediatric): Secondary | ICD-10-CM

## 2018-09-03 NOTE — Patient Instructions (Signed)
Will start Auto-CPAP with pressure range of 5-20 cm H2O.
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
## 2018-09-06 ENCOUNTER — Other Ambulatory Visit: Payer: Self-pay | Admitting: *Deleted

## 2018-09-06 DIAGNOSIS — G4733 Obstructive sleep apnea (adult) (pediatric): Secondary | ICD-10-CM

## 2018-09-06 NOTE — Progress Notes (Signed)
dme order re-entered.

## 2018-09-30 NOTE — Progress Notes (Signed)
Name: Desiree [name]   MRN: [id number]    DOB: 05-16-1971   Date:10/01/2018       Progress Note  Subjective  Chief Complaint  Chief Complaint  Patient presents with  . Follow-up    3 month recheck    HPI  HTN: After her son was born (2013), she started having hot flashes and palpitations, started having anxiety and high blood pressure.  She went to the ER multiple times during that time and was finally put on HCTZ in 2017.  This has worked well to treat her BP fluctuations. Her BP is low-end of normal today, and at home it is normally 110's/60-70's - 2 weeks ago she had a reading of 140/95 x1.  She does try to follow the DASH diet. Denies chest pain, shortness of breath, or palpitations, no BLE edema.  OSA/Daytime Sleepiness: She started CPAP last week - still trying to get comfortable wearing the mask.  Managed by Dr. Ashby Dawes, last visit was 09/03/18  Elevated LDL - has a history of slightly elevated LDL in 2018 - last check was completely normal Denies chest pain, shortness of breath, or palpitations.    Leukopenia/Platelet Dysfunction/Anemia: Saw Dr. Gita Kudo with [facility] Hematology - recommended she stop Abilify, which she did and the anemia and leukopenia seemed to improve.  He did recommend she repeat labs every 2-4 weeks and advised that she may have these done with our office, so we will check today and have her come back in about 2-4 weeks for repeat.  She has positive lupus anticoagulant and platelet dysfunction while pregnant  Taste Disturbance: She gets a sour taste in her mouth sometimes - improved with omeprazole.  Denies chest pain, no cough. Does feel like she has to clear her throat more frequently lately - has tonsil stones and feels like her uvula is larger than normal.  Denies abdominal pain.  Diverticular Disease: She had diverticulitis flare 06/14/2018.  Had colonoscopy 6-7 years ago, but was told to return in 5 years - we will refer today.  Denies N/V/D, She denies  having any blood in stool, no longer having constipation  Depression: Off of abilify and just taking escitalopram for depression - She sees Dr. Mardene Sayer with the [facility] in [place]. PHQ-9 is decreased today.  Denies SI/HI. The holidays went okay for her.   [facility] Visit from 10/01/2018 in [facility]  PHQ-9 Total Score  17     Obesity: She gained 8lbs since last visit.  She signed up for [employer] and is planning to start going. Her diet has been healthy - mostly vegetables, chicken, etc. Trying to cut out carbohydrates.   Patient Active Problem List   Diagnosis Date Noted  . 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 Colon cancer screening   . Anxiety and depression 06/28/2018  . Dysphagia 06/28/2018  . Loud snoring 06/28/2018  . Excessive daytime sleepiness 06/28/2018  . Elevated LDL cholesterol level 06/28/2018  . Fatigue 06/28/2018  . Unspecified disturbances of smell and taste 06/28/2018  . History of anal fissures 06/28/2018  . Endometriosis of uterus 06/14/2017  . 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 Hypertension 04/05/2017  . 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 Abdominal hernia 04/01/2012  . Abnormal Pap smear, low grade squamous intraepithelial lesion (LGSIL) 01/03/2012  . Lupus anticoagulant positive 04/07/2011  . Anemia complicating pregnancy in first trimester 04/07/2011  . 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 Lupus anticoagulant positive 11/07/2010  Past Surgical History:  Procedure Laterality Date  . ANAL FISSURE REPAIR  2008  . COLONOSCOPY WITH PROPOFOL N/A 07/11/2018   Procedure: COLONOSCOPY WITH PROPOFOL;  Surgeon: Lin Landsman, MD;  Location[facility] ENDOSCOPY;  Service: Gastroenterology;  Laterality: N/A;  . EXCISIONAL HEMORRHOIDECTOMY  1443   Bleeding complication  . HERNIA REPAIR      Family History  Problem Relation Age of Onset  . Stroke Mother        late 16s  .  Hypertension Mother   . Lupus Cousin   . Breast cancer Cousin 41  . Cancer Father        Prostate  . Healthy Brother   . Hypertension Maternal Grandmother   . Heart attack Maternal Grandmother        before age 50  . Cancer Maternal Grandfather        Throat  . Hypertension Paternal Grandmother   . Cancer Paternal Grandmother        Jaw  . Stroke Paternal Grandfather        before age 23  . Hypertension Brother   . Healthy Son   . Breast cancer Cousin 59   Social History   Socioeconomic History  . Marital status: Single    Spouse name: Not on file  . Number of children: 1  . Years of education: Not on file  . Highest education level: Not on file  Occupational History  . Occupation: Pharmacist, hospital    Comment: [employer]  . Financial resource strain: Not hard at all  . Food insecurity:    Worry: Never true    Inability: Never true  . Transportation needs:    Medical: No    Non-medical: No  Tobacco Use  . Smoking status: Never Smoker  . Smokeless tobacco: Never Used  Substance and Sexual Activity  . Alcohol use: No  . Drug use: No  . Sexual activity: Yes    Birth control/protection: Patch  Lifestyle  . Physical activity:    Days per week: 2 days    Minutes per session: 30 min  . Stress: Rather much  Relationships  . Social connections:    Talks on phone: More than three times a week    Gets together: More than three times a week    Attends religious service: More than 4 times per year    Active member of club or organization: No    Attends meetings of clubs or organizations: Never    Relationship status: Never married  . Intimate partner violence:    Fear of current or ex partner: No    Emotionally abused: No    Physically abused: No    Forced sexual activity: No  Other Topics Concern  . Not on file  Social History Narrative  . Not on file     Current Outpatient Medications:  .  escitalopram (LEXAPRO) 20 MG tablet, Take 20 mg by mouth  daily., Disp: , Rfl:  .  estradiol (VIVELLE-DOT) 0.05 MG/24HR patch, Place 1 patch onto the skin 2 (two) times a week. Wed and Saturday., Disp: , Rfl:  .  hydrochlorothiazide (HYDRODIURIL) 12.5 MG tablet, TAKE 1 TABLET BY MOUTH EVERY DAY, Disp: 90 tablet, Rfl: 0 .  omeprazole (PRILOSEC) 20 MG capsule, Take 1 capsule (20 mg total) by mouth 2 (two) times daily before a meal., Disp: 30 capsule, Rfl: 0 .  ARIPiprazole (ABILIFY) 5 MG tablet, Take 5 mg by mouth daily., Disp: ,  Rfl:   Allergies  Allergen Reactions  . Biotin Hives and Palpitations    I personally reviewed active problem list, medication list, allergies, health maintenance, notes from last encounter, lab results with the patient/caregiver today.   ROS Constitutional: Negative for fever or weight change.  Respiratory: Negative for cough and shortness of breath.   Cardiovascular: Negative for chest pain or palpitations.  Gastrointestinal: Negative for abdominal pain, no bowel changes.  Musculoskeletal: Negative for gait problem or joint swelling.  Skin: Negative for rash.  Neurological: Negative for dizziness or headache.  No other specific complaints in a complete review of systems (except as listed in HPI above).  Objective  Vitals:   10/01/18 0812  BP: 122/76  Pulse: 88  Resp: 16  Temp: 98.1 F (36.7 C)  TempSrc: Oral  SpO2: 97%  Weight: 171 lb 14.4 oz (78 kg)  Height: 5\' 3"  (1.6 m)   Body mass index is 30.45 kg/m.  Physical Exam Constitutional: Patient appears well-developed and well-nourished. No distress.  HENT: Head: Normocephalic and atraumatic. Nose: Nose normal. Mouth/Throat: Oropharynx is clear and moist. No oropharyngeal exudate or tonsillar swelling.  Eyes: Conjunctivae and EOM are normal. No scleral icterus. Neck: Normal range of motion. Neck supple. No JVD present. No thyromegaly present.  Cardiovascular: Normal rate, regular rhythm and normal heart sounds.  No murmur heard. No BLE  edema. Pulmonary/Chest: Effort normal and breath sounds normal. No respiratory distress. Musculoskeletal: Normal range of motion, no joint effusions. No gross deformities Neurological: Pt is alert and oriented to person, place, and time. No cranial nerve deficit. Coordination, balance, strength, speech and gait are normal.  Skin: Skin is warm and dry. No rash noted. No erythema.  Psychiatric: Patient has a normal mood and affect. behavior is normal. Judgment and thought content normal.  No results found for this or any previous visit (from the past 72 hour(s)).  PHQ2/9: Depression screen [facility] 2/9 10/01/2018 06/28/2018 04/18/2017 11/21/2016  Decreased Interest 2 1 0 0  Down, Depressed, Hopeless 1 1 0 1  PHQ - 2 Score 3 2 0 1  Altered sleeping 3 3 0 1  Tired, decreased energy 3 3 0 1  Change in appetite 3 3 0 0  Feeling bad or failure about yourself  2 3 0 0  Trouble concentrating 3 3 0 0  Moving slowly or fidgety/restless 0 3 0 0  Suicidal thoughts - 1 0 0  PHQ-9 Score 17 21 0 3  Difficult doing work/chores Somewhat difficult Somewhat difficult - -   Fall Risk: Fall Risk  10/01/2018 06/28/2018 11/21/2016  Falls in the past year? 0 No No  Number falls in past yr: 0 - -  Injury with Fall? 0 - -   Assessment & Plan  1. Essential hypertension - Stable on HCTZ, discussed coming off, but she declines today - no lightheadedness or orthostatic changes, so we can maintain at low dose  2. Elevated LDL cholesterol level - Stable and well controlled with diet  3. Sleep apnea, unspecified type - Continue CPAP and seeing pulmonology  4. Lupus anticoagulant positive Keep Heme follow up  5. Platelet dysfunction (HCC) Keep Heme follow up - CBC w/Diff/Platelet  6. Leukopenia, unspecified type Keep Heme follow up - CBC w/Diff/Platelet  7. Dysphagia, unspecified type Continue Omeprazole  8. Unspecified disturbances of smell and taste - Keep Heme follow up  9. Diverticular disease of  colon Stable  10. Anxiety and depression Keep psychiatry follow up  11. Anemia, unspecified  type Keep Heme follow up - CBC w/Diff/Platelet

## 2018-10-01 ENCOUNTER — Ambulatory Visit: Payer: BC Managed Care – PPO | Admitting: Family Medicine

## 2018-10-01 ENCOUNTER — Encounter: Payer: Self-pay | Admitting: Family Medicine

## 2018-10-01 VITALS — BP 122/76 | HR 88 | Temp 98.1°F | Resp 16 | Ht 63.0 in | Wt 171.9 lb

## 2018-10-01 DIAGNOSIS — F329 Major depressive disorder, single episode, unspecified: Secondary | ICD-10-CM

## 2018-10-01 DIAGNOSIS — G4730 Sleep apnea, unspecified: Secondary | ICD-10-CM

## 2018-10-01 DIAGNOSIS — R439 Unspecified disturbances of smell and taste: Secondary | ICD-10-CM

## 2018-10-01 DIAGNOSIS — D649 Anemia, unspecified: Secondary | ICD-10-CM

## 2018-10-01 DIAGNOSIS — R760 Raised antibody titer: Secondary | ICD-10-CM

## 2018-10-01 DIAGNOSIS — E7800 Pure hypercholesterolemia, unspecified: Secondary | ICD-10-CM

## 2018-10-01 DIAGNOSIS — D691 Qualitative platelet defects: Secondary | ICD-10-CM

## 2018-10-01 DIAGNOSIS — F419 Anxiety disorder, unspecified: Secondary | ICD-10-CM

## 2018-10-01 DIAGNOSIS — I10 Essential (primary) hypertension: Secondary | ICD-10-CM | POA: Diagnosis not present

## 2018-10-01 DIAGNOSIS — R1310 Dysphagia, unspecified: Secondary | ICD-10-CM

## 2018-10-01 DIAGNOSIS — K5730 Diverticulosis of large intestine without perforation or abscess without bleeding: Secondary | ICD-10-CM

## 2018-10-01 DIAGNOSIS — D72819 Decreased white blood cell count, unspecified: Secondary | ICD-10-CM | POA: Insufficient documentation

## 2018-10-01 LAB — CBC WITH DIFFERENTIAL/PLATELET
ABSOLUTE MONOCYTES: 465 {cells}/uL (ref 200–950)
BASOS ABS: 9 {cells}/uL (ref 0–200)
BASOS PCT: 0.2 %
EOS ABS: 41 {cells}/uL (ref 15–500)
Eosinophils Relative: 0.9 %
HCT: 34.3 % — ABNORMAL LOW (ref 35.0–45.0)
HEMOGLOBIN: 11.2 g/dL — AB (ref 11.7–15.5)
Lymphs Abs: 1444 cells/uL (ref 850–3900)
MCH: 26 pg — AB (ref 27.0–33.0)
MCHC: 32.7 g/dL (ref 32.0–36.0)
MCV: 79.6 fL — AB (ref 80.0–100.0)
MPV: 10.5 fL (ref 7.5–12.5)
Monocytes Relative: 10.1 %
NEUTROS ABS: 2640 {cells}/uL (ref 1500–7800)
Neutrophils Relative %: 57.4 %
Platelets: 345 10*3/uL (ref 140–400)
RBC: 4.31 10*6/uL (ref 3.80–5.10)
RDW: 12.8 % (ref 11.0–15.0)
Total Lymphocyte: 31.4 %
WBC: 4.6 10*3/uL (ref 3.8–10.8)

## 2018-10-21 ENCOUNTER — Telehealth: Payer: Self-pay | Admitting: Family Medicine

## 2018-10-21 DIAGNOSIS — D72819 Decreased white blood cell count, unspecified: Secondary | ICD-10-CM

## 2018-10-21 DIAGNOSIS — D691 Qualitative platelet defects: Secondary | ICD-10-CM

## 2018-10-21 LAB — CBC WITH DIFFERENTIAL/PLATELET
ABSOLUTE MONOCYTES: 442 {cells}/uL (ref 200–950)
Basophils Absolute: 9 cells/uL (ref 0–200)
Basophils Relative: 0.2 %
EOS ABS: 9 {cells}/uL — AB (ref 15–500)
Eosinophils Relative: 0.2 %
HEMATOCRIT: 33.4 % — AB (ref 35.0–45.0)
HEMOGLOBIN: 10.9 g/dL — AB (ref 11.7–15.5)
LYMPHS ABS: 1763 {cells}/uL (ref 850–3900)
MCH: 26.1 pg — AB (ref 27.0–33.0)
MCHC: 32.6 g/dL (ref 32.0–36.0)
MCV: 80.1 fL (ref 80.0–100.0)
MONOS PCT: 9.4 %
MPV: 10.5 fL (ref 7.5–12.5)
NEUTROS ABS: 2477 {cells}/uL (ref 1500–7800)
Neutrophils Relative %: 52.7 %
Platelets: 340 10*3/uL (ref 140–400)
RBC: 4.17 10*6/uL (ref 3.80–5.10)
RDW: 13 % (ref 11.0–15.0)
Total Lymphocyte: 37.5 %
WBC: 4.7 10*3/uL (ref 3.8–10.8)

## 2018-10-21 NOTE — Telephone Encounter (Signed)
-----   Message from Hubbard Hartshorn, FNP sent at 10/01/2018  8:36 AM EST ----- Regarding: CBC Recheck Please call patient to come in for CBC recheck

## 2018-10-21 NOTE — Telephone Encounter (Signed)
Patient notified

## 2018-10-22 ENCOUNTER — Other Ambulatory Visit: Payer: Self-pay | Admitting: Family Medicine

## 2018-10-22 DIAGNOSIS — D72819 Decreased white blood cell count, unspecified: Secondary | ICD-10-CM

## 2018-10-22 DIAGNOSIS — D691 Qualitative platelet defects: Secondary | ICD-10-CM

## 2018-11-25 ENCOUNTER — Encounter: Payer: Self-pay | Admitting: Nurse Practitioner

## 2018-11-25 ENCOUNTER — Ambulatory Visit (INDEPENDENT_AMBULATORY_CARE_PROVIDER_SITE_OTHER): Payer: BC Managed Care – PPO | Admitting: Nurse Practitioner

## 2018-11-25 VITALS — BP 110/72 | HR 82 | Temp 97.8°F | Resp 16 | Ht 63.0 in | Wt 167.1 lb

## 2018-11-25 DIAGNOSIS — L608 Other nail disorders: Secondary | ICD-10-CM

## 2018-11-25 NOTE — Progress Notes (Signed)
Name: Desiree [name]   MRN: [id number]    DOB: [birth year]   Date:11/25/2018       Progress Note  Subjective  Chief Complaint  Chief Complaint  Patient presents with  . Nail Problem    patient stated that she thinks she has fungus on her fingernails. she has a green spot on her nail. no itching or burning    HPI  paitient noticed discoloration to her right hand middle three digits under finger nail yesterday. Sh has yellow coloring in the middle of each nails. Yesterday she took off her pink acrylic nails and noticed this. No pain, redness, tenderness, drainage, thickening of nails.   Patient Active Problem List   Diagnosis Date Noted  . Leukopenia 10/01/2018  . Sleep apnea 10/01/2018  . 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 Colon cancer screening   . Anxiety and depression 06/28/2018  . Dysphagia 06/28/2018  . Loud snoring 06/28/2018  . Excessive daytime sleepiness 06/28/2018  . Elevated LDL cholesterol level 06/28/2018  . Fatigue 06/28/2018  . Unspecified disturbances of smell and taste 06/28/2018  . History of anal fissures 06/28/2018  . Endometriosis of uterus 06/14/2017  . 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 Hypertension 04/05/2017  . 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 Abdominal hernia 04/01/2012  . Abnormal Pap smear, low grade squamous intraepithelial lesion (LGSIL) 01/03/2012  . Lupus anticoagulant positive 04/07/2011  . Anemia complicating pregnancy in first trimester 04/07/2011  . 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 Lupus anticoagulant positive 11/07/2010    Past Medical History:  Diagnosis Date  . Abnormal Pap smear   . Abuse    h/o  . Allergy   . Anemia   . Arm fracture   . 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 Breast lesion    left breast-h/o  . Depression    h/o  . H/O lupus anticoagulant disorder   . Headache(784.0)   . History of chicken pox   . HPV in  female   . Humerus fracture 03/2011   right   . Hx of colposcopy with cervical biopsy 03/13/11  . Hx of migraines   . Hypertension   . LGSIL (low grade squamous intraepithelial dysplasia)    h/o  . Ovarian cyst, left   . Pelvic pain complicating pregnancy    h/o  . Rhinitis, chronic   . Varicose veins    h/o    Past Surgical History:  Procedure Laterality Date  . ANAL FISSURE REPAIR  2008  . COLONOSCOPY WITH PROPOFOL N/A 07/11/2018   Procedure: COLONOSCOPY WITH PROPOFOL;  Surgeon: Lin Landsman, MD;  Location: [facility] ENDOSCOPY;  Service: Gastroenterology;  Laterality: N/A;  . EXCISIONAL HEMORRHOIDECTOMY  9450   Bleeding complication  . HERNIA REPAIR      Social History   Tobacco Use  . Smoking status: Never Smoker  . Smokeless tobacco: Never Used  Substance Use Topics  . Alcohol use: No     Current Outpatient Medications:  .  escitalopram (LEXAPRO) 20 MG tablet, Take 20 mg by mouth daily., Disp: , Rfl:  .  estradiol (VIVELLE-DOT) 0.05 MG/24HR patch, Place 1 patch onto the skin 2 (two) times a week. Wed and Saturday., Disp: , Rfl:  .  hydrochlorothiazide (HYDRODIURIL) 12.5 MG tablet, TAKE 1 TABLET BY MOUTH EVERY DAY, Disp: 90 tablet, Rfl: 0 .  lamoTRIgine (LAMICTAL) 25 MG tablet, , Disp: , Rfl:  .  omeprazole (PRILOSEC) 20 MG capsule, Take 1 capsule (20 mg total) by mouth 2 (two) times daily before a meal., Disp: 30 capsule, Rfl: 0  Allergies  Allergen Reactions  . Biotin Hives and Palpitations    ROS  No other specific complaints in a complete review of systems (except as listed in HPI above).  Objective  Vitals:   11/25/18 1204  BP: 110/72  Pulse: 82  Resp: 16  Temp: 97.8 F (36.6 C)  TempSrc: Oral  SpO2: 98%  Weight: 167 lb 1.6 oz (75.8 kg)  Height: 5\' 3"  (1.6 m)     Body mass index is 29.6 kg/m.  Nursing Note and Vital Signs reviewed.  Physical Exam Constitutional:      Appearance: Normal appearance.  Cardiovascular:     Rate and  Rhythm: Normal rate and regular rhythm.  Pulmonary:     Effort: Pulmonary effort is normal.     Breath sounds: Normal breath sounds.  Musculoskeletal:       Hands:  Skin:    Nails: There is no clubbing.   Neurological:     General: No focal deficit present.     Mental Status: She is alert and oriented to person, place, and time.  Psychiatric:        Behavior: Behavior normal.       No results found for this or any previous visit (from the past 48 hour(s)).  Assessment & Plan  1. Discoloration of nail Vinegar water soaks daily, refer to Derm.  Presently does not appear to be chloronychia or onychomycosis.  If worsening or not improving please let us know if develops redness drainage heat or streaking down finger please get urgent medical attention

## 2018-11-25 NOTE — Patient Instructions (Addendum)
-   1 to 3-4 cups of vinegar:water soak in for 10-15 minutes daily for the next few weeks.  - please call dermatology to schedule an appointment, if it resolves please cancel as soon as possible.

## 2018-12-13 ENCOUNTER — Inpatient Hospital Stay: Admission: RE | Admit: 2018-12-13 | Payer: BC Managed Care – PPO | Source: Ambulatory Visit

## 2019-01-01 ENCOUNTER — Ambulatory Visit: Admit: 2019-01-01 | Payer: BC Managed Care – PPO | Admitting: Otolaryngology

## 2019-01-01 SURGERY — TONSILLECTOMY
Anesthesia: General | Laterality: Bilateral

## 2019-01-04 ENCOUNTER — Other Ambulatory Visit: Payer: Self-pay | Admitting: Family Medicine

## 2019-01-31 ENCOUNTER — Ambulatory Visit (INDEPENDENT_AMBULATORY_CARE_PROVIDER_SITE_OTHER): Payer: BC Managed Care – PPO | Admitting: Family Medicine

## 2019-01-31 ENCOUNTER — Encounter: Payer: Self-pay | Admitting: Family Medicine

## 2019-01-31 ENCOUNTER — Other Ambulatory Visit: Payer: Self-pay

## 2019-01-31 VITALS — BP 118/75 | HR 79

## 2019-01-31 DIAGNOSIS — E7800 Pure hypercholesterolemia, unspecified: Secondary | ICD-10-CM

## 2019-01-31 DIAGNOSIS — I10 Essential (primary) hypertension: Secondary | ICD-10-CM | POA: Diagnosis not present

## 2019-01-31 DIAGNOSIS — D72819 Decreased white blood cell count, unspecified: Secondary | ICD-10-CM | POA: Diagnosis not present

## 2019-01-31 DIAGNOSIS — K5730 Diverticulosis of large intestine without perforation or abscess without bleeding: Secondary | ICD-10-CM

## 2019-01-31 DIAGNOSIS — F329 Major depressive disorder, single episode, unspecified: Secondary | ICD-10-CM

## 2019-01-31 DIAGNOSIS — R87622 Low grade squamous intraepithelial lesion on cytologic smear of vagina (LGSIL): Secondary | ICD-10-CM

## 2019-01-31 DIAGNOSIS — G4730 Sleep apnea, unspecified: Secondary | ICD-10-CM | POA: Diagnosis not present

## 2019-01-31 DIAGNOSIS — R439 Unspecified disturbances of smell and taste: Secondary | ICD-10-CM

## 2019-01-31 DIAGNOSIS — D691 Qualitative platelet defects: Secondary | ICD-10-CM

## 2019-01-31 DIAGNOSIS — F419 Anxiety disorder, unspecified: Secondary | ICD-10-CM

## 2019-01-31 DIAGNOSIS — F32A Depression, unspecified: Secondary | ICD-10-CM

## 2019-01-31 MED ORDER — HYDROCHLOROTHIAZIDE 12.5 MG PO TABS: 12.5000 mg | ORAL_TABLET | Freq: Every day | ORAL | 0 refills | Status: DC

## 2019-01-31 NOTE — Patient Instructions (Signed)
Sleep Hygiene Tips 1) Get regular. One of the best ways to train your body to sleep well is to go to bed and get up at more or less the same time every day, even on weekends and days off! This regular rhythm will make you feel better and will give your body something to work from. 2) Sleep when sleepy. Only try to sleep when you actually feel tired or sleepy, rather than spending too much time awake in bed. 3) Get up & try again. If you haven't been able to get to sleep after about 20 minutes or more, get up and do something calming or boring until you feel sleepy, then return to bed and try again. Sit quietly on the couch with the lights off (bright light will tell your brain that it is time to wake up), or read something boring like the phone book. Avoid doing anything that is too stimulating or interesting, as this will wake you up even more. 4) Avoid caffeine & nicotine. It is best to avoid consuming any caffeine (in coffee, tea, cola drinks, chocolate, and some medications) or nicotine (cigarettes) for at least 4-6 hours before going to bed. These substances act as stimulants and interfere with the ability to fall asleep 5) Avoid alcohol. It is also best to avoid alcohol for at least 4-6 hours before going to bed. Many people believe that alcohol is relaxing and helps them to get to sleep at first, but it actually interrupts the quality of sleep. 6) Bed is for sleeping. Try not to use your bed for anything other than sleeping and sex, so that your body comes to associate bed with sleep. If you use bed as a place to watch TV, eat, read, work on your laptop, pay bills, and other things, your body will not learn this Connection. 7) No naps. It is best to avoid taking naps during the day, to make sure that you are tired at bedtime. If you can't make it through the day without a nap, make sure it is for less than an hour and before 3pm. 8) Sleep rituals. You can develop your  own rituals of things to remind your body that it is time to sleep - some people find it useful to do relaxing stretches or breathing exercises for 15 minutes before bed each night, or sit calmly with a cup of caffeine-free tea. 9) Bathtime. Having a hot bath 1-2 hours before bedtime can be useful, as it will raise your body temperature, causing you to feel sleepy as your body temperature drops again. Research shows that sleepiness is associated with a drop in body temperature. 10) No clock-watching. Many people who struggle with sleep tend to watch the clock too much. Frequently checking the clock during the night can wake you up (especially if you turn on the light to read the time) and reinforces negative thoughts such as "Oh no, look how late it is, I'll never get to sleep" or "it's so early, I have only slept for 5 hours, this is terrible." 11) Use a sleep diary. This worksheet can be a useful way of making sure you have the right facts about your sleep, rather than making assumptions. Because a diary involves watching the clock (see point 10) it is a good idea to only use it for two weeks to get an idea of what is going and then perhaps two months down the track to see how you are progressing. 12) Exercise. Regular exercise is   a good idea to help with good sleep, but try not to do strenuous exercise in the 4 hours before bedtime. Morning walks are a great way to start the day feeling refreshed! 13) Eat right. A healthy, balanced diet will help you to sleep well, but timing is important. Some people find that a very empty stomach at bedtime is distracting, so it can be useful to have a light snack, but a heavy meal soon before bed can also interrupt sleep. Some people recommend a warm glass of milk, which contains tryptophan, which acts as a natural sleep inducer. 14) The right space. It is very important that your bed and bedroom are quiet and comfortable for sleeping. A  cooler room with enough blankets to stay warm is best, and make sure you have curtains or an eyemask to block out early morning light and earplugs if there is noise outside your room. 15) Keep daytime routine the same. Even if you have a bad night sleep and are tired it is important that you try to keep your daytime activities the same as you had planned. That is, don't avoid activities because you feel tired. This can reinforce the insomnia.    

## 2019-01-31 NOTE — Progress Notes (Signed)
Name: Desiree [name]   MRN: [id number]    DOB: 21-Jun-1971   Date:01/31/2019       Progress Note  Subjective  Chief Complaint  Chief Complaint  Patient presents with  . Follow-up  . Pelvic Pain    LLQ sharp stabbing pain with pressure has hx of fibriods, onset 4 days    I connected with  Shayne Alken  on 01/31/19 at  9:00 AM EDT by a video enabled telemedicine application and verified that I am speaking with the correct person using two identifiers.  I discussed the limitations of evaluation and management by telemedicine and the availability of in person appointments. The patient expressed understanding and agreed to proceed. Staff also discussed with the patient that there may be a patient responsible charge related to this service. Patient Location: Home Provider Location: Home Additional Individuals present: none  HPI  HTN: After her son was born (2013), she started having hot flashes and palpitations, started having anxiety and high blood pressure. She went to the ER multiple times during that time and was finally put on HCTZ in 2017. This has worked well to treat her BP fluctuations.She does try to follow the DASH diet. Denies chest pain, shortness of breath, or palpitations, no BLE edema.  She is taking HCTZ 12.5mg  as needed - her BP was low end of normal at last visit. Avg is 110's/60's.  OSA/Daytime Sleepiness: She is using her CPAP daily, but is waking occasionally during the night.  She is still adjusting to wearing her mask, has trouble getting to sleep some nights.  Managed by Dr. Ashby Dawes, last visit was 09/03/18.  Discussed sleep hygiene.   Elevated LDL- has a history of slightly elevated LDL in 2019 - last check was completely normal.Denies chest pain, shortness of breath, or palpitations.  She has been exercising regularly and is down to 160lbs.  Leukopenia/Platelet Dysfunction/Anemia: Saw Dr. Gita Kudo with [facility]ematology - recommended she stop Abilify,  which she did and the anemia and leukopenia seemed to improve.  He did recommend she repeat labs every 2-4 weeks and advised that she may have these done with our office, she forgot to come in after her last check - will come in next week.  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  Taste Disturbance/GERD: She gets a sour taste in her mouth sometimes - improved with omeprazole - has not had any issues recently, so she stopped the omeprazole. Denies chest pain, no cough. Has hx tonsil stones, no difficulty swallowing lately. Denies abdominal pain.  Diverticular Disease: She had diverticulitis flare 06/14/2018. Had colonoscopy October 2019 with Dr. Marius Ditch and was found to still have diverticulosis, but no other acute issues. Denies N/V/D, She denies having any blood in stool, no longer having constipation  Depression: Off of abilify; was taking lexapro, but it caused pins and needles in her neck, so she was switched to Lamictal for about 1-2  months - She sees Dr. Mardene Sayer with the [facility] in [place]. PHQ-9 is stable today.  Denies SI/HI. Her Dad has been ill recently - she is caring for him and her mother.  Has her 66yo son at home with her now as well.   [facility] Visit from 01/31/2019 in [facility]  PHQ-9 Total Score  7     Obesity: She notes has been working on losing weight and is down to 160lbs. Her diet has been healthy - mostly vegetables, chicken,  etc. Trying to cut out carbohydrates.   Hx LGSIL: Due for Pap - will schedule for 3-4 weeks from today.   Patient Active Problem List   Diagnosis Date Noted  . Leukopenia 10/01/2018  . Sleep apnea 10/01/2018  . 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 Colon cancer screening   . Anxiety and depression 06/28/2018  . Dysphagia 06/28/2018  . Elevated LDL cholesterol level 06/28/2018  . Fatigue 06/28/2018  . Unspecified disturbances of smell and taste 06/28/2018  . History of anal  fissures 06/28/2018  . Endometriosis of uterus 06/14/2017  . 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 Hypertension 04/05/2017  . 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 Abdominal hernia 04/01/2012  . Abnormal Pap smear, low grade squamous intraepithelial lesion (LGSIL) 01/03/2012  . Anemia complicating pregnancy in first trimester 04/07/2011  . 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 Lupus anticoagulant positive 11/07/2010   Past Surgical History:  Procedure Laterality Date  . ANAL FISSURE REPAIR  2008  . COLONOSCOPY WITH PROPOFOL N/A 07/11/2018   Procedure: COLONOSCOPY WITH PROPOFOL;  Surgeon: Lin Landsman, MD;  Location: [facility] ENDOSCOPY;  Service: Gastroenterology;  Laterality: N/A;  . EXCISIONAL HEMORRHOIDECTOMY  2542   Bleeding complication  . HERNIA REPAIR      Family History  Problem Relation Age of Onset  . Stroke Mother        late 59s  . Hypertension Mother   . Lupus Cousin   . Breast cancer Cousin 60  . Cancer Father        Prostate  . Healthy Brother   . Hypertension Maternal Grandmother   . Heart attack Maternal Grandmother        before age 56  . Cancer Maternal Grandfather        Throat  . Hypertension Paternal Grandmother   . Cancer Paternal Grandmother        Jaw  . Stroke Paternal Grandfather        before age 57  . Hypertension Brother   . Healthy Son   . Breast cancer Cousin 78    Social History   Socioeconomic History  . Marital status: Single    Spouse name: Not on file  . Number of children: 1  . Years of education: Not on file  . Highest education level: Not on file  Occupational History  . Occupation: Pharmacist, hospital    Comment: [employer]  . Financial resource strain: Not hard at all  . Food insecurity:    Worry: Never true    Inability: Never true  . Transportation needs:    Medical: No    Non-medical: No  Tobacco Use  . Smoking status: Never Smoker  . Smokeless tobacco: Never  Used  Substance and Sexual Activity  . Alcohol use: No  . Drug use: No  . Sexual activity: Yes    Partners: Male    Birth control/protection: Patch  Lifestyle  . Physical activity:    Days per week: 2 days    Minutes per session: 30 min  . Stress: Rather much  Relationships  . Social connections:    Talks on phone: More than three times a week    Gets together: More than three times a week    Attends religious service: More than 4 times per year    Active member of club or organization: No    Attends meetings of clubs or organizations: Never    Relationship status: Never married  .  Intimate partner violence:    Fear of current or ex partner: No    Emotionally abused: No    Physically abused: No    Forced sexual activity: No  Other Topics Concern  . Not on file  Social History Narrative  . Not on file     Current Outpatient Medications:  .  hydrochlorothiazide (HYDRODIURIL) 12.5 MG tablet, TAKE 1 TABLET BY MOUTH EVERY DAY (Patient taking differently: Take 12.5 mg by mouth as needed. ), Disp: 90 tablet, Rfl: 0 .  lamoTRIgine (LAMICTAL) 100 MG tablet, Take 100 mg by mouth daily., Disp: , Rfl:  .  omeprazole (PRILOSEC) 20 MG capsule, Take 1 capsule (20 mg total) by mouth 2 (two) times daily before a meal. (Patient taking differently: Take 20 mg by mouth daily as needed (acid reflux). ), Disp: 30 capsule, Rfl: 0 .  escitalopram (LEXAPRO) 20 MG tablet, Take 20 mg by mouth daily., Disp: , Rfl:   Allergies  Allergen Reactions  . Biotin Hives and Palpitations  . Nylon Itching    I personally reviewed active problem list, medication list, allergies, health maintenance, notes from last encounter, lab results with the patient/caregiver today.   ROS Constitutional: Negative for fever or weight change.  Respiratory: Negative for cough and shortness of breath.   Cardiovascular: Negative for chest pain or palpitations.  Gastrointestinal: Negative for abdominal pain, no bowel  changes.  Musculoskeletal: Negative for gait problem or joint swelling.  Skin: Negative for rash.  Neurological: Negative for dizziness or headache.  No other specific complaints in a complete review of systems (except as listed in HPI above).  Objective  Virtual encounter, vitals not obtained.  There is no height or weight on file to calculate BMI.  Physical Exam Constitutional: Patient appears well-developed and well-nourished. No distress.  HENT: Head: Normocephalic and atraumatic.  Neck: Normal range of motion. Pulmonary/Chest: Effort normal. No respiratory distress. Speaking in complete sentences Neurological: Pt is alert and oriented to person, place, and time. Coordination, speech are normal.  Psychiatric: Patient has a normal mood and affect. behavior is normal. Judgment and thought content normal.  No results found for this or any previous visit (from the past 72 hour(s)).  PHQ2/9: Depression screen [facility] 2/9 01/31/2019 11/25/2018 10/01/2018 06/28/2018 04/18/2017  Decreased Interest 0 0 2 1 0  Down, Depressed, Hopeless 0 0 1 1 0  PHQ - 2 Score 0 0 3 2 0  Altered sleeping 2 0 3 3 0  Tired, decreased energy 1 1 3 3  0  Change in appetite 1 0 3 3 0  Feeling bad or failure about yourself  1 0 2 3 0  Trouble concentrating 2 1 3 3  0  Moving slowly or fidgety/restless 0 0 0 3 0  Suicidal thoughts 0 - - 1 0  PHQ-9 Score 7 2 17 21  0  Difficult doing work/chores Somewhat difficult Not difficult at all Somewhat difficult Somewhat difficult -   PHQ-2/9 Result is positive.    Fall Risk: Fall Risk  01/31/2019 11/25/2018 10/01/2018 06/28/2018 11/21/2016  Falls in the past year? 0 0 0 No No  Number falls in past yr: 0 0 0 - -  Injury with Fall? 0 0 0 - -    Assessment & Plan  1. Essential hypertension - Doing well, taking HCTZ only PRN at this point.  Checking BP's several times a week.  2. Sleep apnea, unspecified type - Discussed sleep hygiene in detail.  She is still having some  difficulty follow-up falling asleep with her mask on, but is using it nightly.  Follow-up with  3. Elevated LDL cholesterol level Not due for recheck.  No medication warranted at this time.  She is working on healthy lifestyle changes.  4. Leukopenia, unspecified type We will check CBC next week when she is able to come into the office, and again in 4 weeks when she comes in for her physical.  Labs to be forwarded to hematologist - CBC w/Diff/Platelet  5. Platelet dysfunction (HCC) - CBC w/Diff/Platelet  6. Unspecified disturbances of smell and taste Improved.  Advised may take omeprazole as needed, however she is not taking currently.  7. Diverticular disease of colon Doing well, no symptoms, no recent flares.  8. Anxiety and depression Seeing psychiatry, on Lamictal at this time.  9. LGSIL Pap smear of vagina Schedule Pap in 1 month.   I discussed the assessment and treatment plan with the patient. The patient was provided an opportunity to ask questions and all were answered. The patient agreed with the plan and demonstrated an understanding of the instructions.  The patient was advised to call back or seek an in-person evaluation if the symptoms worsen or if the condition fails to improve as anticipated.  I provided 26 minutes of non-face-to-face time during this encounter.

## 2019-02-21 ENCOUNTER — Other Ambulatory Visit: Payer: Self-pay

## 2019-02-21 ENCOUNTER — Ambulatory Visit: Payer: Self-pay

## 2019-02-21 ENCOUNTER — Ambulatory Visit (INDEPENDENT_AMBULATORY_CARE_PROVIDER_SITE_OTHER): Payer: BC Managed Care – PPO | Admitting: Family Medicine

## 2019-02-21 ENCOUNTER — Other Ambulatory Visit (HOSPITAL_COMMUNITY)
Admission: RE | Admit: 2019-02-21 | Discharge: 2019-02-21 | Disposition: A | Discharge status: Home / Self Care | Payer: BC Managed Care – PPO | Source: Ambulatory Visit | Attending: Family Medicine | Admitting: Family Medicine

## 2019-02-21 ENCOUNTER — Encounter: Payer: BC Managed Care – PPO | Admitting: Family Medicine

## 2019-02-21 ENCOUNTER — Encounter: Payer: Self-pay | Admitting: Family Medicine

## 2019-02-21 VITALS — BP 118/72 | HR 96 | Temp 98.6°F | Resp 14 | Ht 63.0 in | Wt 167.6 lb

## 2019-02-21 DIAGNOSIS — Z01419 Encounter for gynecological examination (general) (routine) without abnormal findings: Secondary | ICD-10-CM | POA: Diagnosis not present

## 2019-02-21 DIAGNOSIS — Z636 Dependent relative needing care at home: Secondary | ICD-10-CM

## 2019-02-21 DIAGNOSIS — Z124 Encounter for screening for malignant neoplasm of cervix: Secondary | ICD-10-CM | POA: Diagnosis not present

## 2019-02-21 DIAGNOSIS — Z1231 Encounter for screening mammogram for malignant neoplasm of breast: Secondary | ICD-10-CM

## 2019-02-21 LAB — CBC WITH DIFFERENTIAL/PLATELET
Absolute Monocytes: 471 cells/uL (ref 200–950)
Basophils Absolute: 22 cells/uL (ref 0–200)
Basophils Relative: 0.5 %
Eosinophils Absolute: 40 cells/uL (ref 15–500)
Eosinophils Relative: 0.9 %
HCT: 34.8 % — ABNORMAL LOW (ref 35.0–45.0)
Hemoglobin: 11.5 g/dL — ABNORMAL LOW (ref 11.7–15.5)
Lymphs Abs: 1395 cells/uL (ref 850–3900)
MCH: 26.6 pg — ABNORMAL LOW (ref 27.0–33.0)
MCHC: 33 g/dL (ref 32.0–36.0)
MCV: 80.4 fL (ref 80.0–100.0)
MPV: 10.2 fL (ref 7.5–12.5)
Monocytes Relative: 10.7 %
Neutro Abs: 2473 cells/uL (ref 1500–7800)
Neutrophils Relative %: 56.2 %
Platelets: 336 10*3/uL (ref 140–400)
RBC: 4.33 10*6/uL (ref 3.80–5.10)
RDW: 13.1 % (ref 11.0–15.0)
Total Lymphocyte: 31.7 %
WBC: 4.4 10*3/uL (ref 3.8–10.8)

## 2019-02-21 NOTE — Addendum Note (Signed)
Addended by: Hubbard Hartshorn on: 02/21/2019 10:53 AM   Modules accepted: Orders

## 2019-02-21 NOTE — Chronic Care Management (AMB) (Signed)
  Care Management   Note  02/21/2019 Name: [name] MRN: [id number] DOB: 22-Nov-1970  Desiree [name] is a 48 year old female who sees Raelyn Ensign, FNP for primary care. [name] asked the CCM team to consult the patient for care coordination needs secondary to caregiver strain and respite care needs for her father. Referral was placed today during office visit.  Telephone outreach to patient today to introduce CCM services.  SDOH (Social Determinants of Health) screening performed today. See Care Plan Entry related to challenges with: Stress  [name] was given information about Care Management services today including:  1. Case Management services include personalized support from designated clinical staff supervised by a physician, including individualized plan of care and coordination with other care providers 2. 24/7 contact phone numbers for assistance for urgent and routine care needs. 3. The patient may stop CCM services at any time (effective at the end of the month) by phone call to the office staff.   Patient agreed to services and verbal consent obtained.     Plan:  Telephone appointment scheduled with LCSW 02/27/2019 at 2:00    [place]. Rollene Rotunda, RN, BSN Nurse Care Coordinator [facility] Care Management  [phone]

## 2019-02-21 NOTE — Patient Instructions (Addendum)
Preventive Care 40-64 Years, Female Preventive care refers to lifestyle choices and visits with your health care provider that can promote health and wellness. What does preventive care include?   A yearly physical exam. This is also called an annual well check.  Dental exams once or twice a year.  Routine eye exams. Ask your health care provider how often you should have your eyes checked.  Personal lifestyle choices, including: ? Daily care of your teeth and gums. ? Regular physical activity. ? Eating a healthy diet. ? Avoiding tobacco and drug use. ? Limiting alcohol use. ? Practicing safe sex. ? Taking low-dose aspirin daily starting at age 50. ? Taking vitamin and mineral supplements as recommended by your health care provider. What happens during an annual well check? The services and screenings done by your health care provider during your annual well check will depend on your age, overall health, lifestyle risk factors, and family history of disease. Counseling Your health care provider may ask you questions about your:  Alcohol use.  Tobacco use.  Drug use.  Emotional well-being.  Home and relationship well-being.  Sexual activity.  Eating habits.  Work and work environment.  Method of birth control.  Menstrual cycle.  Pregnancy history. Screening You may have the following tests or measurements:  Height, weight, and BMI.  Blood pressure.  Lipid and cholesterol levels. These may be checked every 5 years, or more frequently if you are over 50 years old.  Skin check.  Lung cancer screening. You may have this screening every year starting at age 55 if you have a 30-pack-year history of smoking and currently smoke or have quit within the past 15 years.  Colorectal cancer screening. All adults should have this screening starting at age 50 and continuing until age 75. Your health care provider may recommend screening at age 45. You will have tests every  1-10 years, depending on your results and the type of screening test. People at increased risk should start screening at an earlier age. Screening tests may include: ? Guaiac-based fecal occult blood testing. ? Fecal immunochemical test (FIT). ? Stool DNA test. ? Virtual colonoscopy. ? Sigmoidoscopy. During this test, a flexible tube with a tiny camera (sigmoidoscope) is used to examine your rectum and lower colon. The sigmoidoscope is inserted through your anus into your rectum and lower colon. ? Colonoscopy. During this test, a long, thin, flexible tube with a tiny camera (colonoscope) is used to examine your entire colon and rectum.  Hepatitis C blood test.  Hepatitis B blood test.  Sexually transmitted disease (STD) testing.  Diabetes screening. This is done by checking your blood sugar (glucose) after you have not eaten for a while (fasting). You may have this done every 1-3 years.  Mammogram. This may be done every 1-2 years. Talk to your health care provider about when you should start having regular mammograms. This may depend on whether you have a family history of breast cancer.  BRCA-related cancer screening. This may be done if you have a family history of breast, ovarian, tubal, or peritoneal cancers.  Pelvic exam and Pap test. This may be done every 3 years starting at age 21. Starting at age 30, this may be done every 5 years if you have a Pap test in combination with an HPV test.  Bone density scan. This is done to screen for osteoporosis. You may have this scan if you are at high risk for osteoporosis. Discuss your test results, treatment options,   and if necessary, the need for more tests with your health care provider. Vaccines Your health care provider may recommend certain vaccines, such as:  Influenza vaccine. This is recommended every year.  Tetanus, diphtheria, and acellular pertussis (Tdap, Td) vaccine. You may need a Td booster every 10 years.  Varicella  vaccine. You may need this if you have not been vaccinated.  Zoster vaccine. You may need this after age 15.  Measles, mumps, and rubella (MMR) vaccine. You may need at least one dose of MMR if you were born in 1957 or later. You may also need a second dose.  Pneumococcal 13-valent conjugate (PCV13) vaccine. You may need this if you have certain conditions and were not previously vaccinated.  Pneumococcal polysaccharide (PPSV23) vaccine. You may need one or two doses if you smoke cigarettes or if you have certain conditions.  Meningococcal vaccine. You may need this if you have certain conditions.  Hepatitis A vaccine. You may need this if you have certain conditions or if you travel or work in places where you may be exposed to hepatitis A.  Hepatitis B vaccine. You may need this if you have certain conditions or if you travel or work in places where you may be exposed to hepatitis B.  Haemophilus influenzae type b (Hib) vaccine. You may need this if you have certain conditions. Talk to your health care provider about which screenings and vaccines you need and how often you need them. This information is not intended to replace advice given to you by your health care provider. Make sure you discuss any questions you have with your health care provider. Document Released: 10/08/2015 Document Revised: 11/01/2017 Document Reviewed: 07/13/2015 Elsevier Interactive Patient Education  2019 [place] Years, Female Preventive care refers to lifestyle choices and visits with your health care provider that can promote health and wellness. What does preventive care include?   A yearly physical exam. This is also called an annual well check.  Dental exams once or twice a year.  Routine eye exams. Ask your health care provider how often you should have your eyes checked.  Personal lifestyle choices, including: ? Daily care of your teeth and gums. ? Regular  physical activity. ? Eating a healthy diet. ? Avoiding tobacco and drug use. ? Limiting alcohol use. ? Practicing safe sex. ? Taking low-dose aspirin daily starting at age 59. ? Taking vitamin and mineral supplements as recommended by your health care provider. What happens during an annual well check? The services and screenings done by your health care provider during your annual well check will depend on your age, overall health, lifestyle risk factors, and family history of disease. Counseling Your health care provider may ask you questions about your:  Alcohol use.  Tobacco use.  Drug use.  Emotional well-being.  Home and relationship well-being.  Sexual activity.  Eating habits.  Work and work Statistician.  Method of birth control.  Menstrual cycle.  Pregnancy history. Screening You may have the following tests or measurements:  Height, weight, and BMI.  Blood pressure.  Lipid and cholesterol levels. These may be checked every 5 years, or more frequently if you are over 92 years old.  Skin check.  Lung cancer screening. You may have this screening every year starting at age 59 if you have a 30-pack-year history of smoking and currently smoke or have quit within the past 15 years.  Colorectal cancer screening. All adults should have this screening  starting at age 36 and continuing until age 67. Your health care provider may recommend screening at age 38. You will have tests every 1-10 years, depending on your results and the type of screening test. People at increased risk should start screening at an earlier age. Screening tests may include: ? Guaiac-based fecal occult blood testing. ? Fecal immunochemical test (FIT). ? Stool DNA test. ? Virtual colonoscopy. ? Sigmoidoscopy. During this test, a flexible tube with a tiny camera (sigmoidoscope) is used to examine your rectum and lower colon. The sigmoidoscope is inserted through your anus into your rectum and  lower colon. ? Colonoscopy. During this test, a long, thin, flexible tube with a tiny camera (colonoscope) is used to examine your entire colon and rectum.  Hepatitis C blood test.  Hepatitis B blood test.  Sexually transmitted disease (STD) testing.  Diabetes screening. This is done by checking your blood sugar (glucose) after you have not eaten for a while (fasting). You may have this done every 1-3 years.  Mammogram. This may be done every 1-2 years. Talk to your health care provider about when you should start having regular mammograms. This may depend on whether you have a family history of breast cancer.  BRCA-related cancer screening. This may be done if you have a family history of breast, ovarian, tubal, or peritoneal cancers.  Pelvic exam and Pap test. This may be done every 3 years starting at age 22. Starting at age 80, this may be done every 5 years if you have a Pap test in combination with an HPV test.  Bone density scan. This is done to screen for osteoporosis. You may have this scan if you are at high risk for osteoporosis. Discuss your test results, treatment options, and if necessary, the need for more tests with your health care provider. Vaccines Your health care provider may recommend certain vaccines, such as:  Influenza vaccine. This is recommended every year.  Tetanus, diphtheria, and acellular pertussis (Tdap, Td) vaccine. You may need a Td booster every 10 years.  Varicella vaccine. You may need this if you have not been vaccinated.  Zoster vaccine. You may need this after age 70.  Measles, mumps, and rubella (MMR) vaccine. You may need at least one dose of MMR if you were born in 1957 or later. You may also need a second dose.  Pneumococcal 13-valent conjugate (PCV13) vaccine. You may need this if you have certain conditions and were not previously vaccinated.  Pneumococcal polysaccharide (PPSV23) vaccine. You may need one or two doses if you smoke  cigarettes or if you have certain conditions.  Meningococcal vaccine. You may need this if you have certain conditions.  Hepatitis A vaccine. You may need this if you have certain conditions or if you travel or work in places where you may be exposed to hepatitis A.  Hepatitis B vaccine. You may need this if you have certain conditions or if you travel or work in places where you may be exposed to hepatitis B.  Haemophilus influenzae type b (Hib) vaccine. You may need this if you have certain conditions. Talk to your health care provider about which screenings and vaccines you need and how often you need them. This information is not intended to replace advice given to you by your health care provider. Make sure you discuss any questions you have with your health care provider. Document Released: 10/08/2015 Document Revised: 11/01/2017 Document Reviewed: 07/13/2015 Elsevier Interactive Patient Education  2019 [employer].  Kegel Exercises Kegel exercises help strengthen the muscles that support the rectum, vagina, small intestine, bladder, and uterus. Doing Kegel exercises can help:  Improve bladder and bowel control.  Improve sexual response.  Reduce problems and discomfort during pregnancy. Kegel exercises involve squeezing your pelvic floor muscles, which are the same muscles you squeeze when you try to stop the flow of urine. The exercises can be done while sitting, standing, or lying down, but it is best to vary your position. Exercises 1. Squeeze your pelvic floor muscles tight. You should feel a tight lift in your rectal area. If you are a female, you should also feel a tightness in your vaginal area. Keep your stomach, buttocks, and legs relaxed. 2. Hold the muscles tight for up to 10 seconds. 3. Relax your muscles. Repeat this exercise 50 times a day or as many times as told by your health care provider. Continue to do this exercise for at least 4-6 weeks or for as long as  told by your health care provider. This information is not intended to replace advice given to you by your health care provider. Make sure you discuss any questions you have with your health care provider. Document Released: 08/28/2012 Document Revised: 01/22/2017 Document Reviewed: 08/01/2015 Elsevier Interactive Patient Education  2019 [employer].

## 2019-02-21 NOTE — Progress Notes (Signed)
Name: Desiree [name]   MRN: [id number]    DOB: 1971-02-10   Date:02/21/2019       Progress Note  Subjective  Chief Complaint  Chief Complaint  Patient presents with  . Annual Exam    with pap    HPI   Patient presents for annual CPE.  Social: Dad has Stage 4 bone cancer and was in ER recently for severe pain.  She is having to do all of his care at home.   Diet: She has been doing better with her diet, but her mom cooks a lot of fried foods. Exercise: She has been doing better - doing Youtube videos for abs, riding bikes more and playing more outside with her son, doing some cardio.  USPSTF grade A and B recommendations    Office Visit from 02/21/2019 in [facility]  AUDIT-C Score  0     Depression: Phq 9 is  Negative - however she is really struggling with the current stress as above. Depression screen [facility] 2/9 02/21/2019 01/31/2019 11/25/2018 10/01/2018 06/28/2018  Decreased Interest 0 0 0 2 1  Down, Depressed, Hopeless 0 0 0 1 1  PHQ - 2 Score 0 0 0 3 2  Altered sleeping 0 2 0 3 3  Tired, decreased energy 0 _0 Change in appetite 0 1 0 3 3  Feeling bad or failure about yourself  0 1 0 2 3  Trouble concentrating 0 _1 Moving slowly or fidgety/restless 0 0 0 0 3  Suicidal thoughts 0 0 - - 1  PHQ-9 Score 0 _2 Difficult doing work/chores Not difficult at all Somewhat difficult Not difficult at all Somewhat difficult Somewhat difficult   Hypertension: BP Readings from Last 3 Encounters:  02/21/19 118/72  01/31/19 118/75  11/25/18 110/72   Obesity: Wt Readings from Last 3 Encounters:  02/21/19 167 lb 9.6 oz (76 kg)  11/25/18 167 lb 1.6 oz (75.8 kg)  10/01/18 171 lb 14.4 oz (78 kg)   BMI Readings from Last 3 Encounters:  02/21/19 29.69 kg/m  11/25/18 29.60 kg/m  10/01/18 30.45 kg/m    Hep C Screening: Has family planning only and declines this test STD testing and prevention (HIV/chl/gon/syphilis): No new partners in the last  year, declines STI screening Intimate partner violence: No concerns Sexual History/Pain during Intercourse: She does have hx of occasional discomfort with intercourse. Would like to do pelvic floor PT after checking with insurance.  Menstrual History/LMP/Abnormal Bleeding: LMP at age 69 - this is consistent with other women in her family.  No vaginal bleeding since then Incontinence Symptoms: No concern for stress/urge incontinence.   Advanced Care Planning: A voluntary discussion about advance care planning including the explanation and discussion of advance directives.  Discussed health care proxy and Living will, and the patient was unable to identify a health care proxy.  Patient does not have a living will at present time. If patient does have living will, I have requested they bring this to the clinic to be scanned in to their chart.  Breast cancer:  HM Mammogram  Date Value Ref Range Status  04/02/2017 Self Reported Normal 0-4 Bi-Rad, Self Reported Normal Final    BRCA gene screening: No family history Cervical cancer screening: Has history of abnormal pap smears in the past, is due for one today.  Osteoporosis Screening: Menopause at age 22; no family history; encouraged weight bearing exercise.  No  results found for: HMDEXASCAN  Lipids:  Lab Results  Component Value Date   CHOL 165 06/28/2018   CHOL 188 11/21/2016   Lab Results  Component Value Date   HDL 58 06/28/2018   HDL 68.00 11/21/2016   Lab Results  Component Value Date   LDLCALC 94 06/28/2018   LDLCALC 110 (H) 11/21/2016   Lab Results  Component Value Date   TRIG 45 06/28/2018   TRIG 50.0 11/21/2016   Lab Results  Component Value Date   CHOLHDL 2.8 06/28/2018   CHOLHDL 3 11/21/2016   No results found for: LDLDIRECT  Glucose:  Glucose, Bld  Date Value Ref Range Status  06/28/2018 87 65 - 139 mg/dL Final    Comment:    .        Non-fasting reference interval .   06/14/2017 107 (H) 70 - 99 mg/dL  Final  04/18/2017 83 70 - 99 mg/dL Final    Skin cancer: No concerning lesions Colorectal cancer: Denies family or personal history of colorectal cancer, no changes in BM's - no blood in stool, dark and tarry stool, mucus in stool, or constipation/diarrhea.  Had colonoscopy in 2019 with Dr. Marius Ditch Lung cancer:  Never Smoker.  Low Dose CT Chest recommended if Age 35-80 years, 30 pack-year currently smoking OR have quit w/in 15years. Patient does not qualify.   ECG: No chest pain, shortness of breath, palpitations.  Patient Active Problem List   Diagnosis Date Noted  . Leukopenia 10/01/2018  . Sleep apnea 10/01/2018  . 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 Colon cancer screening   . Anxiety and depression 06/28/2018  . Dysphagia 06/28/2018  . Elevated LDL cholesterol level 06/28/2018  . Fatigue 06/28/2018  . Unspecified disturbances of smell and taste 06/28/2018  . History of anal fissures 06/28/2018  . Endometriosis of uterus 06/14/2017  . 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 Hypertension 04/05/2017  . 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 Abdominal hernia 04/01/2012  . LGSIL Pap smear of vagina 01/03/2012  . Anemia complicating pregnancy in first trimester 04/07/2011  . 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 Lupus anticoagulant positive 11/07/2010    Past Surgical History:  Procedure Laterality Date  . ANAL FISSURE REPAIR  2008  . COLONOSCOPY WITH PROPOFOL N/A 07/11/2018   Procedure: COLONOSCOPY WITH PROPOFOL;  Surgeon: Lin Landsman, MD;  [facility] ENDOSCOPY;  Service: Gastroenterology;  Laterality: N/A;  . EXCISIONAL HEMORRHOIDECTOMY  8250   Bleeding complication  . HERNIA REPAIR      Family History  Problem Relation Age of Onset  . Stroke Mother        late 44s  . Hypertension Mother   . Lupus Cousin   . Breast cancer Cousin 58  . Cancer Father        Prostate  . Healthy Brother   . Hypertension Maternal Grandmother   .  Heart attack Maternal Grandmother        before age 108  . Cancer Maternal Grandfather        Throat  . Hypertension Paternal Grandmother   . Cancer Paternal Grandmother        Jaw  . Stroke Paternal Grandfather        before age 58  . Hypertension Brother   . Healthy Son   . Breast cancer Cousin 57    Social History   Socioeconomic History  . Marital status: Single    Spouse name: Not on file  . Number of  children: 1  . Years of education: Not on file  . Highest education level: Not on file  Occupational History  . Occupation: Pharmacist, hospital    Comment: [place]  . Financial resource strain: Not hard at all  . Food insecurity:    Worry: Never true    Inability: Never true  . Transportation needs:    Medical: No    Non-medical: No  Tobacco Use  . Smoking status: Never Smoker  . Smokeless tobacco: Never Used  Substance and Sexual Activity  . Alcohol use: No  . Drug use: No  . Sexual activity: Yes    Partners: Male    Birth control/protection: Patch  Lifestyle  . Physical activity:    Days per week: 2 days    Minutes per session: 30 min  . Stress: Rather much  Relationships  . Social connections:    Talks on phone: More than three times a week    Gets together: More than three times a week    Attends religious service: More than 4 times per year    Active member of club or organization: No    Attends meetings of clubs or organizations: Never    Relationship status: Never married  . Intimate partner violence:    Fear of current or ex partner: No    Emotionally abused: No    Physically abused: No    Forced sexual activity: No  Other Topics Concern  . Not on file  Social History Narrative  . Not on file     Current Outpatient Medications:  .  hydrochlorothiazide (HYDRODIURIL) 12.5 MG tablet, Take 1 tablet (12.5 mg total) by mouth daily., Disp: 90 tablet, Rfl: 0 .  lamoTRIgine (LAMICTAL) 100 MG tablet, Take 100 mg by mouth daily., Disp: , Rfl:    Allergies  Allergen Reactions  . Biotin Hives and Palpitations  . Nylon Itching     ROS  Constitutional: Negative for fever or weight change.  Respiratory: Negative for cough and shortness of breath.   Cardiovascular: Negative for chest pain or palpitations.  Gastrointestinal: Negative for abdominal pain, no bowel changes.  Musculoskeletal: Negative for gait problem or joint swelling.  Skin: Negative for rash.  Neurological: Negative for dizziness or headache.  No other specific complaints in a complete review of systems (except as listed in HPI above).  Objective  Vitals:   02/21/19 0926  BP: 118/72  Pulse: 96  Resp: 14  Temp: 98.6 F (37 C)  TempSrc: Oral  SpO2: 99%  Weight: 167 lb 9.6 oz (76 kg)  Height: _0  (1.6 m)    Body mass index is 29.69 kg/m.  Physical Exam Constitutional: Patient appears well-developed and well-nourished. No distress.  HENT: Head: Normocephalic and atraumatic. Ears: B TMs ok, no erythema or effusion; Nose: Nose normal. Mouth/Throat: Oropharynx is clear and moist. No oropharyngeal exudate.  Eyes: Conjunctivae and EOM are normal. Pupils are equal, round, and reactive to light. No scleral icterus.  Neck: Normal range of motion. Neck supple. No JVD present. No thyromegaly present.  Cardiovascular: Normal rate, regular rhythm and normal heart sounds.  No murmur heard. No BLE edema. Pulmonary/Chest: Effort normal and breath sounds normal. No respiratory distress. Abdominal: Soft. Bowel sounds are normal, no distension. There is no tenderness. no masses Breast: no lumps or masses, no nipple discharge or rashes FEMALE GENITALIA:  External genitalia normal External urethra normal Vaginal vault normal without discharge or lesions Cervix normal without discharge or lesions  Bimanual exam normal without masses Musculoskeletal: Normal range of motion, no joint effusions. No gross deformities Neurological: he is alert and oriented to person,  place, and time. No cranial nerve deficit. Coordination, balance, strength, speech and gait are normal.  Skin: Skin is warm and dry. No rash noted. No erythema.  Psychiatric: Patient has a normal mood and affect. behavior is normal. Judgment and thought content normal.  No results found for this or any previous visit (from the past 2160 hour(s)).  PHQ2/9: Depression screen [facility] 2/9 02/21/2019 01/31/2019 11/25/2018 10/01/2018 06/28/2018  Decreased Interest 0 0 0 2 1  Down, Depressed, Hopeless 0 0 0 1 1  PHQ - 2 Score 0 0 0 3 2  Altered sleeping 0 2 0 3 3  Tired, decreased energy 0 _0 Change in appetite 0 1 0 3 3  Feeling bad or failure about yourself  0 1 0 2 3  Trouble concentrating 0 _1 Moving slowly or fidgety/restless 0 0 0 0 3  Suicidal thoughts 0 0 - - 1  PHQ-9 Score 0 _2 Difficult doing work/chores Not difficult at all Somewhat difficult Not difficult at all Somewhat difficult Somewhat difficult   Fall Risk: Fall Risk  02/21/2019 01/31/2019 11/25/2018 10/01/2018 06/28/2018  Falls in the past year? 0 0 0 0 No  Number falls in past yr: 0 0 0 0 -  Injury with Fall? 0 0 0 0 -    Functional Status Survey: Is the patient deaf or have difficulty hearing?: No Does the patient have difficulty seeing, even when wearing glasses/contacts?: No Does the patient have difficulty concentrating, remembering, or making decisions?: No Does the patient have difficulty walking or climbing stairs?: No Does the patient have difficulty dressing or bathing?: No Does the patient have difficulty doing errands alone such as visiting a doctor's office or shopping?: No  Assessment & Plan  1. Well woman exam -USPSTF grade A and B recommendations reviewed with patient; age-appropriate recommendations, preventive care, screening tests, etc discussed and encouraged; healthy living encouraged; see AVS for patient education given to patient -Discussed importance of 150 minutes of physical activity  weekly, eat two servings of fish weekly, eat one serving of tree nuts ( cashews, pistachios, pecans, almonds.Marland Kitchen) every other day, eat 6 servings of fruit/vegetables daily and drink plenty of water and avoid sweet beverages.  - MM 3D SCREEN BREAST BILATERAL; Future  2. Caregiver burden - Ambulatory referral to Chronic Care Management Services  3. Breast cancer screening by mammogram - MM 3D SCREEN BREAST BILATERAL; Future

## 2019-02-24 LAB — CYTOLOGY - PAP: Diagnosis: NEGATIVE

## 2019-02-27 ENCOUNTER — Ambulatory Visit: Payer: Self-pay | Admitting: *Deleted

## 2019-02-27 ENCOUNTER — Telehealth: Payer: Self-pay

## 2019-02-27 DIAGNOSIS — F32A Depression, unspecified: Secondary | ICD-10-CM

## 2019-02-27 DIAGNOSIS — Z636 Dependent relative needing care at home: Secondary | ICD-10-CM

## 2019-02-27 DIAGNOSIS — F329 Major depressive disorder, single episode, unspecified: Secondary | ICD-10-CM

## 2019-02-27 NOTE — Patient Instructions (Signed)
Thank you allowing the Chronic Care Management Team to be a part of your care! It was a pleasure speaking with you today!  1. Please call this social worker with any questions or concerns related to caregiver strain.       CCM (Chronic Care Management) Team   Trish Fountain RN, BSN Nurse Care Coordinator  [phone]  Ruben Reason PharmD  Clinical Pharmacist  [phone]   Elliot Gurney, LCSW Clinical Social Worker [phone]  Goals Addressed            This Visit's Progress   . "I am exhausted with the care for my father" (pt-stated)       Current Barriers:  . Limited social support . Family and relationship dysfunction  Clinical Social Work Clinical Goal(s):  Marland Kitchen Over the next 30 days, client will work with SW to address concerns related to caregiver strain  Interventions: . Patient interviewed and appropriate assessments performed . Provided mental health counseling with regard to caregiver strain and depression (mental health diagnosis or concern) . Discussed plans with patient for ongoing care management follow up and provided patient with direct contact information for care management team  Patient Self Care Activities:  . Self administers medications as prescribed . Attends all scheduled provider appointments . Calls provider office for new concerns or questions  Initial goal documentation         The patient verbalized understanding of instructions provided today and declined a print copy of patient instruction materials.   The care management team will reach out to the patient again over the next 14 days.

## 2019-02-27 NOTE — Chronic Care Management (AMB) (Signed)
   Care Management    Clinical Social Work General Note  02/27/2019 Name: Desiree [name] MRN: [id number] DOB: 12-24-1970  Desiree [name] is a 48 y.o. year old female who is a primary care patient of Hubbard Hartshorn, FNP. The CCM was consulted to assist the patient with Caregiver Stress.   Review of patient status, including review of consultants reports, relevant laboratory and other test results, and collaboration with appropriate care team members and the patient's provider was performed as part of comprehensive patient evaluation and provision of chronic care management services.    SDOH (Social Determinants of Health) screening performed today. See Care Plan Entry related to challenges with: Depression   Stress  Goals Addressed            This Visit's Progress   . "I am exhausted with the care for my father" (pt-stated)       This social worker spoke to patient today by phone. Patient discussed being the primary caregiver for her terminally ill father who is currently receiving Hospice Services through [employer]. Per patient, she is also providing care for her 73 year old son and her mother. Patient verbalized being currently unemployed due to care giving demands. She further discussed experienceing heart palpitations, depression, increased stress and fatigue. Patient discussed being on a antidepressant and is also in counseling with the [facility]. Patient would like assistance with finding in home help for her father as well as assistance in providing overall care for him.   Current Barriers:  . Limited social support . Family and relationship dysfunction  Clinical Social Work Clinical Goal(s):  Marland Kitchen Over the next 30 days, client will work with SW to address concerns related to caregiver strain  Interventions: . Patient interviewed and appropriate assessments performed . Provided mental health counseling in regards  to caregiver strain and burnout (mental health  diagnosis or concern) . Discussed importance of self care and positive coping . Discussed plans with patient for ongoing care management follow up and provided patient with direct contact information for care management team . Collaborated with [facility] (community agency) re: Respite Care for patient Spoke with Maudry Mayhew [phone] who agreed to refer patient for a social work referral through [employer] to assess for in home care needs .   Patient Self Care Activities:  . Self administers medications as prescribed . Attends all scheduled provider appointments . Calls provider office for new concerns or questions  Initial goal documentation         Follow Up Plan: SW will follow up with patient by phone over the next 14 days       [place], [place] Worker  [facility] Care Management [phone]

## 2019-03-11 ENCOUNTER — Ambulatory Visit: Payer: Self-pay | Admitting: *Deleted

## 2019-03-11 DIAGNOSIS — Z636 Dependent relative needing care at home: Secondary | ICD-10-CM

## 2019-03-11 DIAGNOSIS — F329 Major depressive disorder, single episode, unspecified: Secondary | ICD-10-CM

## 2019-03-11 DIAGNOSIS — F419 Anxiety disorder, unspecified: Secondary | ICD-10-CM

## 2019-03-11 NOTE — Patient Instructions (Signed)
Thank you allowing the Chronic Care Management Team to be a part of your care! It was a pleasure speaking with you today!  1. Please continue to utilize positive coping, prioritizing tasks and practicing self care 2. Please call this social worker with any additional community resource needs  CCM (Chronic Care Management) Team   Trish Fountain RN, BSN Nurse Care Coordinator  [phone]  Ruben Reason PharmD  Clinical Pharmacist  [phone]   Elliot Gurney, LCSW Clinical Social Worker [phone]  Goals Addressed            This Visit's Progress   . "I am exhausted with the care for my father" (pt-stated)       Current Barriers:  . Limited social support . Family and relationship dysfunction  Clinical Social Work Clinical Goal(s):  Marland Kitchen Over the next 30 days, client will work with SW to address concerns related to caregiver strain  Interventions: . Patient interviewed and appropriate assessments performed . Provided mental health support in regards  to caregiver strain and burnout (mental health diagnosis or concern) . Discussed importance of self care, prioritizing tasks and positive coping . Discussed plans with patient for ongoing care management follow up and provided patient with direct contact information for care management team . Confirmed that the [facility] social worker did follow up with patient, suggesting that patient follow through with the 13 hours that [employer] offers. Per patient, she is looking at [facility] to provide the in home care and will follow up with this agency.   Patient Self Care Activities:  . Self administers medications as prescribed . Attends all scheduled provider appointments . Calls provider office for new concerns or questions  Please see past updates related to this goal by clicking on the "Past Updates" button in the selected goal          The patient verbalized understanding of instructions provided today and  declined a print copy of patient instruction materials.   The care management team will reach out to the patient again over the next 14 days.

## 2019-03-11 NOTE — Chronic Care Management (AMB) (Signed)
  Chronic Care Management    Clinical Social Work General Follow Up Note  03/11/2019 Name: Desiree [name] MRN: [id number] DOB: 09-11-1971  Desiree [name] is a 48 y.o. year old female who is a primary care patient of Hubbard Hartshorn, FNP. The CCM team was consulted for assistance with Caregiver Stress.    Review of patient status, including review of consultants reports, relevant laboratory and other test results, and collaboration with appropriate care team members and the patient's provider was performed as part of comprehensive patient evaluation and provision of chronic care management services.    Goals Addressed            This Visit's Progress   . "I am exhausted with the care for my father" (pt-stated)       Current Barriers:  . Limited social support . Family and relationship dysfunction  Clinical Social Work Clinical Goal(s):  Marland Kitchen Over the next 30 days, client will work with SW to address concerns related to caregiver strain  Interventions: . Patient interviewed and appropriate assessments performed . Provided mental health support in regards  to caregiver strain and burnout (mental health diagnosis or concern) . Discussed importance of self care, prioritizing tasks and positive coping . Discussed plans with patient for ongoing care management follow up and provided patient with direct contact information for care management team . Confirmed that the [facility] social worker did follow up with patient, suggesting that patient follow through with the 13 hours that [employer] offers. Per patient, she is looking at [facility] to provide the in home care and will follow up with this agency.   Patient Self Care Activities:  . Self administers medications as prescribed . Attends all scheduled provider appointments . Calls provider office for new concerns or questions  Please see past updates related to this goal by clicking on the "Past Updates" button in the selected  goal           Follow Up Plan: SW will follow up with patient by phone over the next 2 weeks   [place], [place] Worker  [facility] Care Management [phone]

## 2019-03-25 ENCOUNTER — Ambulatory Visit: Payer: Self-pay | Admitting: *Deleted

## 2019-03-25 DIAGNOSIS — F329 Major depressive disorder, single episode, unspecified: Secondary | ICD-10-CM

## 2019-03-25 DIAGNOSIS — Z636 Dependent relative needing care at home: Secondary | ICD-10-CM

## 2019-03-25 DIAGNOSIS — F32A Depression, unspecified: Secondary | ICD-10-CM

## 2019-03-25 NOTE — Patient Instructions (Signed)
Thank you allowing the Chronic Care Management Team to be a part of your care! It was a pleasure speaking with you today!  1. Please call [facility] [phone] to assist with the Aid and Attendance application process through the [place] affairs for her family member. 2. Please call this social worker with questions or concerns regarding arranging in home care for your family member.       CCM (Chronic Care Management) Team   Trish Fountain RN, BSN Nurse Care Coordinator  [phone]  Ruben Reason PharmD  Clinical Pharmacist  [phone]   Elliot Gurney, LCSW Clinical Social Worker [phone]  Goals Addressed            This Visit's Progress   . "I am exhausted with the care for my father" (pt-stated)       Current Barriers:  . Limited social support . Family and relationship dysfunction  Clinical Social Work Clinical Goal(s):  Marland Kitchen Over the next 30 days, client will work with SW to address concerns related to caregiver strain  Interventions: . Patient interviewed and appropriate assessments performed . Provided supportive counseling in regards to caregiver strain and burnout (mental health diagnosis or concern) . Discussed importance of self care, prioritizing tasks and positive coping including arranging in home care to assist with her family members care needs . Confirmed with patient that her family member served in the [employer] and may be eligible for Aid and Attendance through [employer]. . Collaboration phone call to [facility] [phone] who confirmed that they have a contract with [facility] and have the availability to assist with the Aid and Attendance application process. . Discussed plans with patient for ongoing care management follow up and provided patient with direct contact information for care management team    Patient Self Care Activities:  . Self administers medications as prescribed . Attends  all scheduled provider appointments . Calls provider office for new concerns or questions  Please see past updates related to this goal by clicking on the "Past Updates" button in the selected goal          The patient verbalized understanding of instructions provided today and declined a print copy of patient instruction materials.   The care management team will reach out to the patient again over the next 14  days.

## 2019-03-25 NOTE — Chronic Care Management (AMB) (Addendum)
  Chronic Care Management    Clinical Social Work Follow Up Note  03/25/2019 Name: Laure Leone MRN: [id number] DOB: Aug 25, 1971  Desiree [name] is a 48 y.o. year old female who is a primary care patient of Hubbard Hartshorn, FNP. The CCM team was consulted for assistance with Caregiver Stress.   Review of patient status, including review of consultants reports, other relevant assessments, and collaboration with appropriate care team members and the patient's provider was performed as part of comprehensive patient evaluation and provision of chronic care management services.     Goals Addressed            This Visit's Progress   . "I am exhausted with the care for my father" (pt-stated)       Current Barriers:  . Limited social support . Family and relationship dysfunction  Clinical Social Work Clinical Goal(s):  Marland Kitchen Over the next 30 days, client will work with SW to address concerns related to caregiver strain  Interventions: . Patient interviewed and appropriate assessments performed . Provided supportive counseling in regards to caregiver strain and burnout (mental health diagnosis or concern) . Discussed importance of self care, prioritizing tasks and positive coping including arranging in home care to assist with her family members care needs . Confirmed with patient that her family member served in the [employer] and may be eligible for Aid and Attendance through [employer]. . Collaboration phone call to [facility] [phone] who confirmed that they have a contract with [facility] and have the availability to assist with the Aid and Attendance application process. Contact information provided to patient to call. . Discussed plans with patient for ongoing care management follow up and provided patient with direct contact information for care management team    Patient Self Care Activities:  . Self administers medications as prescribed . Attends all  scheduled provider appointments . Calls provider office for new concerns or questions  Please see past updates related to this goal by clicking on the "Past Updates" button in the selected goal          Follow Up Plan: SW will follow up with patient by phone over the next 2 weeks   [place], [place] Worker  [facility] Care Management [phone]

## 2019-03-26 ENCOUNTER — Ambulatory Visit: Payer: Self-pay | Admitting: *Deleted

## 2019-03-26 DIAGNOSIS — F329 Major depressive disorder, single episode, unspecified: Secondary | ICD-10-CM

## 2019-03-26 DIAGNOSIS — F419 Anxiety disorder, unspecified: Secondary | ICD-10-CM

## 2019-03-26 DIAGNOSIS — Z636 Dependent relative needing care at home: Secondary | ICD-10-CM

## 2019-03-27 NOTE — Chronic Care Management (AMB) (Signed)
  Care Management   Social Work Note  03/27/2019 Name: [name] MRN: [id number] DOB: Jun 24, 1971  Desiree [name] is a 48 y.o. year old female who sees Hubbard Hartshorn, FNP for primary care. The CCM team was consulted for assistance with Caregiver Stress   Phone call from patient stating that she had contacted [facility] and they told her that they did not service her area. They may not be able to assist with the Aid and Attendance application process due to this.  Goals Addressed   None     Follow Up Plan: SW will follow up with patient by phone over the next 2 weeks after follow up with [facility], [place] Worker  [facility] Care Management [phone]

## 2019-04-03 ENCOUNTER — Ambulatory Visit: Payer: Self-pay | Admitting: *Deleted

## 2019-04-03 DIAGNOSIS — F329 Major depressive disorder, single episode, unspecified: Secondary | ICD-10-CM

## 2019-04-03 DIAGNOSIS — Z636 Dependent relative needing care at home: Secondary | ICD-10-CM

## 2019-04-03 DIAGNOSIS — F419 Anxiety disorder, unspecified: Secondary | ICD-10-CM

## 2019-04-03 NOTE — Chronic Care Management (AMB) (Signed)
   Care Management    Clinical Social Work Follow Up Note  04/03/2019 Name: Desiree [name] MRN: [id number] DOB: 1971/03/28  Tennile Styles is a 48 y.o. year old female who is a primary care patient of Hubbard Hartshorn, FNP. The CCM team was consulted for assistance with Caregiver Stress.   Review of patient status, including review of consultants reports, other relevant assessments, and collaboration with appropriate care team members and the patient's provider was performed as part of comprehensive patient evaluation and provision of chronic care management services.     Goals Addressed            This Visit's Progress   . "I am exhausted with the care for my father" (pt-stated)       Current Barriers:  . Limited social support . Family and relationship dysfunction  Clinical Social Work Clinical Goal(s):  Marland Kitchen Over the next 30 days, client will work with SW to address concerns related to caregiver strain  Interventions: . Patient interviewed and appropriate assessments performed .  Collaboration call to [facility] confirmed ability to service patient's in [place], however did not have a contract with the [facility] [place] at this time. They would just need to call the [place], [place] to ask if they would send them an authorization to provide in home care. . Confirmed that patient has followed up with the Department of [facility] and patient has been approved for home care services through [employer] using patient's Aid and Attendance benefit . Patient's mother's reluctance to receive help discussed, however patient understands the need and benefit to accepting the help and plans to follow through with coordinating the in home assistance.  . Patient encouraged to call this social worker with any additional questions or concerns regarding her in home care needs.     Patient Self Care Activities:  . Self administers medications as prescribed . Attends  all scheduled provider appointments . Calls provider office for new concerns or questions  Please see past updates related to this goal by clicking on the "Past Updates" button in the selected goal          Follow Up Plan: Client will follow up with [facility] to coordinate in home care for her family member   Elliot Gurney, [place] Worker  [facility] Care Management [phone]

## 2019-04-03 NOTE — Patient Instructions (Signed)
Thank you allowing the Chronic Care Management Team to be a part of your care! It was a pleasure speaking with you today!  1. Please continue to follow up with your outpatient mental health provider 2. Please follow up with [facility] to arrange in home care for her family member 35. Please call this social worker if you have any questions or concerns regarding in home care  CCM (Chronic Care Management) Team   Trish Fountain RN, BSN Nurse Care Coordinator  [phone]  Ruben Reason PharmD  Clinical Pharmacist  [phone]   [place], [place] Social Worker [phone]  Goals Addressed            This Visit's Progress   . "I am exhausted with the care for my father" (pt-stated)       Current Barriers:  . Limited social support . Family and relationship dysfunction  Clinical Social Work Clinical Goal(s):  Marland Kitchen Over the next 30 days, client will work with SW to address concerns related to caregiver strain  Interventions: . Patient interviewed and appropriate assessments performed .  Collaboration call to [facility] confirmed ability to service patient's in [place], however did not have a contract with the [facility] [place] at this time. They would just need to call the [place], [place] to ask if they would send them an authorization to provide in home care. . Confirmed that patient has followed up with the Department of [facility] and patient has been approved for home care services through [employer] using patient's Aid and Attendance benefit . Patient's mother's reluctance to receive help discussed, however patient understands the need and benefit to accepting the help and plans to follow through with coordinating the in home assistance.  . Patient encouraged to call this social worker with any additional questions or concerns regarding her in home care needs.     Patient Self Care Activities:  . Self administers  medications as prescribed . Attends all scheduled provider appointments . Calls provider office for new concerns or questions  Please see past updates related to this goal by clicking on the "Past Updates" button in the selected goal          The patient verbalized understanding of instructions provided today and declined a print copy of patient instruction materials.   No further follow up required: patient will coordinate in home care with [facility]

## 2019-05-05 ENCOUNTER — Other Ambulatory Visit: Payer: Self-pay

## 2019-05-05 ENCOUNTER — Encounter: Payer: Self-pay | Admitting: Family Medicine

## 2019-05-05 ENCOUNTER — Ambulatory Visit (INDEPENDENT_AMBULATORY_CARE_PROVIDER_SITE_OTHER): Payer: BC Managed Care – PPO | Admitting: Family Medicine

## 2019-05-05 DIAGNOSIS — G4730 Sleep apnea, unspecified: Secondary | ICD-10-CM

## 2019-05-05 DIAGNOSIS — E7800 Pure hypercholesterolemia, unspecified: Secondary | ICD-10-CM

## 2019-05-05 DIAGNOSIS — F329 Major depressive disorder, single episode, unspecified: Secondary | ICD-10-CM

## 2019-05-05 DIAGNOSIS — K5730 Diverticulosis of large intestine without perforation or abscess without bleeding: Secondary | ICD-10-CM

## 2019-05-05 DIAGNOSIS — F419 Anxiety disorder, unspecified: Secondary | ICD-10-CM

## 2019-05-05 DIAGNOSIS — I10 Essential (primary) hypertension: Secondary | ICD-10-CM

## 2019-05-05 DIAGNOSIS — R439 Unspecified disturbances of smell and taste: Secondary | ICD-10-CM

## 2019-05-05 DIAGNOSIS — D691 Qualitative platelet defects: Secondary | ICD-10-CM

## 2019-05-05 NOTE — Patient Instructions (Signed)
Call Dr. Georgann Housekeeper office to schedule follow up appointment.

## 2019-05-05 NOTE — Progress Notes (Signed)
Name: Desiree [name]   MRN: [id number]    DOB: 1970/09/29   Date:05/05/2019       Progress Note  Subjective  Chief Complaint  Chief Complaint  Patient presents with  . Follow-up    I connected with  Shayne Alken  on 05/05/19 at  8:20 AM EDT by a video enabled telemedicine application and verified that I am speaking with the correct person using two identifiers.  I discussed the limitations of evaluation and management by telemedicine and the availability of in person appointments. The patient expressed understanding and agreed to proceed. Staff also discussed with the patient that there may be a patient responsible charge related to this service. Patient Location: Home Provider Location: Office Additional Individuals present: None  HPI  HTN: After her son was born (2013), she started having hot flashes and palpitations, started having anxiety and high blood pressure. She went to the ER multiple times during that time and was finally put on HCTZ in 2017. Spiked around when her Dad passed at 147/97, over the last few days it has been 110's/70's. She does try to follow the DASH diet. Denies chest pain, shortness of breath, or palpitations, no BLE edema.  She is taking HCTZ 12.5mg  daily.  OSA/Daytime Sleepiness: She is using her CPAP daily, but is waking occasionally during the night.  Mask is now much more comfortable and fitted correctly. Managed by Dr. Ashby Dawes.  Discussed sleep hygiene.   Elevated LDL/Obesity: has a history of slightly elevated LDL in 2019 -last check was completely normal.Denies chest pain, shortness of breath, or palpitations.She had been exercising regularly and was down to 160lbs, however she was having a lot of stress around the passing of her father, and is working on getting back on track.  Leukopenia/Platelet Dysfunction/Anemia: Saw Dr. Gita Kudo with [facility] - recommended she stop Abilify, which she did and the anemia and leukopenia seemed  to improve. He did recommend she repeat labs every 2-4 weeks for a period of time - she still has some mild anemia, but her WBC's and platelets have been normal. 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  Needs to schedule follow up appt.  Taste Disturbance/GERD: She gets a sour taste in her mouth sometimes -improved with omeprazole - taking this PRN. Denies chest pain, no cough. Has hx tonsil stones, no difficulty swallowing lately.   Diverticular Disease: She had diverticulitis flare 06/14/2018. Had colonoscopy October 2019 with Dr. Marius Ditch and was found to have diverticulosis, but no other acute issues. Denies N/V/D,She denies having any blood in stool, no longer having constipation.  Did have some discomfort last week, but is not having pain over the last 3 days.  Depression:Off ofabilify; was taking lexapro, but it caused pins and needles in her neck, so she was switched to Lamictal for about 1-2  months - She sees Dr. Mardene Sayer with the [facility] in [place].PHQ-9 is stable today. Denies SI/HI.Marland Kitchen  Has her 48yo son at home with her now as well.  Her father passed April 03, 2019; still helping to care for her mother.   Office Visit from 05/05/2019 in [facility]  PHQ-9 Total Score  1     Patient Active Problem List   Diagnosis Date Noted  . Leukopenia 10/01/2018  . Sleep apnea 10/01/2018  . 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 Colon cancer screening   . Anxiety and depression 06/28/2018  . Dysphagia 06/28/2018  . Elevated LDL  cholesterol level 06/28/2018  . Fatigue 06/28/2018  . Unspecified disturbances of smell and taste 06/28/2018  . History of anal fissures 06/28/2018  . Endometriosis of uterus 06/14/2017  . 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 Hypertension 04/05/2017  . 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 Abdominal hernia 04/01/2012  . LGSIL Pap smear of vagina 01/03/2012   . Anemia complicating pregnancy in first trimester 04/07/2011  . 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 Lupus anticoagulant positive 11/07/2010    Past Surgical History:  Procedure Laterality Date  . ANAL FISSURE REPAIR  2008  . COLONOSCOPY WITH PROPOFOL N/A 07/11/2018   Procedure: COLONOSCOPY WITH PROPOFOL;  Surgeon: Lin Landsman, MD;  Location: [facility] ENDOSCOPY;  Service: Gastroenterology;  Laterality: N/A;  . EXCISIONAL HEMORRHOIDECTOMY  2426   Bleeding complication  . HERNIA REPAIR      Family History  Problem Relation Age of Onset  . Stroke Mother        late 68s  . Hypertension Mother   . Lupus Cousin   . Breast cancer Cousin 1  . Cancer Father        Prostate  . Healthy Brother   . Hypertension Maternal Grandmother   . Heart attack Maternal Grandmother        before age 64  . Cancer Maternal Grandfather        Throat  . Hypertension Paternal Grandmother   . Cancer Paternal Grandmother        Jaw  . Stroke Paternal Grandfather        before age 23  . Hypertension Brother   . Healthy Son   . Breast cancer Cousin 50    Social History   Socioeconomic History  . Marital status: Single    Spouse name: Not on file  . Number of children: 1  . Years of education: Not on file  . Highest education level: Not on file  Occupational History  . Occupation: Pharmacist, hospital    Comment: [employer]  . Financial resource strain: Not hard at all  . Food insecurity    Worry: Never true    Inability: Never true  . Transportation needs    Medical: No    Non-medical: No  Tobacco Use  . Smoking status: Never Smoker  . Smokeless tobacco: Never Used  Substance and Sexual Activity  . Alcohol use: No  . Drug use: No  . Sexual activity: Yes    Partners: Male    Birth control/protection: Patch  Lifestyle  . Physical activity    Days per week: 2 days    Minutes per session: 30 min  . Stress: Rather much  Relationships  . Social connections    Talks  on phone: More than three times a week    Gets together: More than three times a week    Attends religious service: More than 4 times per year    Active member of club or organization: No    Attends meetings of clubs or organizations: Never    Relationship status: Never married  . Intimate partner violence    Fear of current or ex partner: No    Emotionally abused: No    Physically abused: No    Forced sexual activity: No  Other Topics Concern  . Not on file  Social History Narrative  . Not on file     Current Outpatient Medications:  .  hydrochlorothiazide (HYDRODIURIL) 12.5 MG tablet, Take 1 tablet (12.5  mg total) by mouth daily., Disp: 90 tablet, Rfl: 0 .  lamoTRIgine (LAMICTAL) 100 MG tablet, Take 100 mg by mouth daily., Disp: , Rfl:   Allergies  Allergen Reactions  . Biotin Hives and Palpitations  . Nylon Itching    I personally reviewed active problem list, medication list, allergies, health maintenance, notes from last encounter, lab results with the patient/caregiver today.   ROS  Constitutional: Negative for fever or weight change.  Respiratory: Negative for cough and shortness of breath.   Cardiovascular: Negative for chest pain or palpitations.  Gastrointestinal: Negative for abdominal pain, no bowel changes.  Musculoskeletal: Negative for gait problem or joint swelling.  Skin: Negative for rash.  Neurological: Negative for dizziness or headache.  No other specific complaints in a complete review of systems (except as listed in HPI above).  Objective  Virtual encounter, vitals not obtained.  There is no height or weight on file to calculate BMI.  Physical Exam  Constitutional: Patient appears well-developed and well-nourished. No distress.  HENT: Head: Normocephalic and atraumatic.  Neck: Normal range of motion. Pulmonary/Chest: Effort normal. No respiratory distress. Speaking in complete sentences Neurological: Pt is alert and oriented to person,  place, and time. Coordination, speech and gait are normal.  Psychiatric: Patient has a normal mood and affect. behavior is normal. Judgment and thought content normal.  No results found for this or any previous visit (from the past 72 hour(s)).  PHQ2/9: Depression screen [facility] 2/9 05/05/2019 02/27/2019 02/21/2019 02/21/2019 01/31/2019  Decreased Interest 0 0 0 0 0  Down, Depressed, Hopeless 0 1 0 0 0  PHQ - 2 Score 0 1 0 0 0  Altered sleeping 1 1 0 0 2  Tired, decreased energy 0 1 0 0 1  Change in appetite 0 0 0 0 1  Feeling bad or failure about yourself  0 0 0 0 1  Trouble concentrating 0 1 0 0 2  Moving slowly or fidgety/restless 0 0 0 0 0  Suicidal thoughts 0 0 0 0 0  PHQ-9 Score 1 4 0 0 7  Difficult doing work/chores Not difficult at all Somewhat difficult Not difficult at all Not difficult at all Somewhat difficult  Some recent data might be hidden   PHQ-2/9 Result is negative.    Fall Risk: Fall Risk  05/05/2019 02/21/2019 01/31/2019 11/25/2018 10/01/2018  Falls in the past year? 0 0 0 0 0  Number falls in past yr: 0 0 0 0 0  Injury with Fall? 0 0 0 0 0  Follow up Falls evaluation completed - - - -    Assessment & Plan  1. Essential hypertension - If BP >140/90 for 3+ days, will call to discuss - increase HCTZ to 25mg . - BASIC METABOLIC PANEL WITH GFR  2. Sleep apnea, unspecified type - Wearing CPAP, seeing Dr. Ashby Dawes  3. Elevated LDL cholesterol level - Lipid panel  4. Platelet dysfunction (HCC) - Needs follow up with Dr. Gita Kudo, she is out of insurance currently, so we will check labs here in office until able to see him - CBC with Differential/Platelet  5. Unspecified disturbances of smell and taste - Stable with PRN omeprazole  6. Diverticular disease of colon - Monitor abdominal discomfort - if it returns, call our office to schedule in-person eval.  7. Anxiety and depression - Lamictal, doing well despite grieving the loss of her father.   I discussed the  assessment and treatment plan with the patient. The patient was provided an  opportunity to ask questions and all were answered. The patient agreed with the plan and demonstrated an understanding of the instructions.  The patient was advised to call back or seek an in-person evaluation if the symptoms worsen or if the condition fails to improve as anticipated.  I provided 22 minutes of non-face-to-face time during this encounter.

## 2019-07-03 ENCOUNTER — Encounter: Payer: Self-pay | Admitting: Family Medicine

## 2019-07-26 ENCOUNTER — Ambulatory Visit: Admit: 2019-07-26 | Payer: BC Managed Care – PPO | Admitting: Otolaryngology

## 2019-07-26 SURGERY — TONSILLECTOMY
Anesthesia: General | Laterality: Bilateral

## 2019-09-05 ENCOUNTER — Other Ambulatory Visit: Payer: Self-pay

## 2019-09-05 ENCOUNTER — Ambulatory Visit (INDEPENDENT_AMBULATORY_CARE_PROVIDER_SITE_OTHER): Payer: Medicaid Other | Admitting: Family Medicine

## 2019-09-05 ENCOUNTER — Encounter: Payer: Self-pay | Admitting: Family Medicine

## 2019-09-05 DIAGNOSIS — R439 Unspecified disturbances of smell and taste: Secondary | ICD-10-CM

## 2019-09-05 DIAGNOSIS — K5730 Diverticulosis of large intestine without perforation or abscess without bleeding: Secondary | ICD-10-CM

## 2019-09-05 DIAGNOSIS — F419 Anxiety disorder, unspecified: Secondary | ICD-10-CM

## 2019-09-05 DIAGNOSIS — E7800 Pure hypercholesterolemia, unspecified: Secondary | ICD-10-CM

## 2019-09-05 DIAGNOSIS — G4730 Sleep apnea, unspecified: Secondary | ICD-10-CM

## 2019-09-05 DIAGNOSIS — M25551 Pain in right hip: Secondary | ICD-10-CM

## 2019-09-05 DIAGNOSIS — D72819 Decreased white blood cell count, unspecified: Secondary | ICD-10-CM

## 2019-09-05 DIAGNOSIS — F329 Major depressive disorder, single episode, unspecified: Secondary | ICD-10-CM

## 2019-09-05 DIAGNOSIS — D649 Anemia, unspecified: Secondary | ICD-10-CM

## 2019-09-05 DIAGNOSIS — D691 Qualitative platelet defects: Secondary | ICD-10-CM

## 2019-09-05 DIAGNOSIS — I10 Essential (primary) hypertension: Secondary | ICD-10-CM

## 2019-09-05 MED ORDER — MELOXICAM 15 MG PO TABS: 15.0000 mg | ORAL_TABLET | Freq: Every day | ORAL | 0 refills | Status: DC

## 2019-09-05 NOTE — Patient Instructions (Addendum)
Please come back in for labs ASAP.  Here are some resources to help you if you feel you are in a mental health crisis:  [facility] - Call [phone]  for help - Website with more resources: [email or web address].pt  [employer] - Call [phone] for help. - Mobile Crisis Program available 24 hours a day, 365 days a year. - Available for anyone of any age in [place] counties.  [facility] - Address: [address], [place] - Telephone: [phone]  - Hours of Operation: Sunday - Saturday - 8:00 a.m. - 8:00 p.m. - Medicaid, Medicare (Government Issued Only), BCBS, and [place] [facility], [place], Psychiatrists on-site to provide medication management, [place], and Peer Support Care.  National Mobile Crisis: [phone] - [place] available 24 hours a day, 365 days a year. - Available for anyone of any age in [place]

## 2019-09-05 NOTE — Progress Notes (Signed)
Name: Desiree [name]   MRN: [id number]    DOB: 02/11/1971   Date:09/05/2019       Progress Note  Subjective  Chief Complaint  Chief Complaint  Patient presents with  . Follow-up    4 month    I connected with  Shayne Alken on 09/05/19 at 10:00 AM EST by telephone and verified that I am speaking with the correct person using two identifiers.  I discussed the limitations, risks, security and privacy concerns of performing an evaluation and management service by telephone and the availability of in person appointments. Staff also discussed with the patient that there may be a patient responsible charge related to this service. Patient Location: Home Provider Location: Office Additional Individuals present: None  HPI   Right hip pain: She has been having some anterior right hip pain that has been occurring for a few minutes up to 30 minutes.  The pain is sudden, sharp and stabbing, and causes her to feel like she is not able to walk when this occurs because bearing weight is so painful during her flares.  Denies swelling, redness, tenderness, numbness/tingling.  She does note 7 years ago she feel off of a horse and injured the right hip/buttock - has never had issues since then.  Discussed NSAID and/or referral to ortho - she is agreeable to both.  HTN: After her son was born (2013), she started having hot flashes and palpitations, started having anxiety and high blood pressure. She went to the ER multiple times during that time and was finally put on HCTZ in 2017. Spiked again when her Dad passed. Over the last several months she has been checking her BP at home and has been in normal range.  She does try to follow the DASH diet. Denies chest pain, shortness of breath, or palpitations, no BLE edema.She is taking HCTZ 12.5mg  daily which we will maintain.  OSA/Daytime Sleepiness: Sheis using her CPAP daily, but is waking occasionally during the night. Mask is now much more  comfortable and fitted correctly. Doing well, wearing nightly, she is sleeping better since going back on her Lamictal.  Elevated LDL/Obesity: has a history of slightly elevated LDL in 2019-last check was completely normal - due for lab recheck.Denies chest pain, shortness of breath, or palpitations.She had been exercising regularly and was down to 160lbs, however her RIGHT hip pain has prevented her from exercising the last couple of weeks.    Leukopenia/Platelet Dysfunction/Anemia: Saw Dr. Gita Kudo with [facility] Hematology - recommended she stop Abilify, which she did and the anemia and leukopenia seemed to improve.  She has not followed up with labs with our clinic or Dr. Gita Kudo due to loss of insurance; she will come in for labs in the next week. 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  Needs to schedule follow up appt.  Taste Disturbance/GERD: She gets a sour taste in her mouth sometimes -improved with omeprazole - taking this PRN - has not needed it in a while.  Does have some epigastric discomfort - advised to take Omeprazole for this and eat GERD diet.  If not improving in 2 weeks, will call back.Denies chest pain, no cough.  Depression:Off ofabilify; was taking lexapro, but it caused pins and needles in her neck, so she was switched to Lamictal, then lost her medicaid and could not afford the medication or to follow up with psychiatry.  She restarted Lamictal 3 days ago and is already noticing some improvement.  She sees Dr. Mardene Sayer with the [facility] in [place] - is planning to call today to schedule follow up now that she has Medicaid back. Denies SI/HI today; has had some passive SI over the last 2 weeks here and there - no plan, contracts for safety and will call psychiatry to schedule follow up. Has her 53yo son at home with her.  Her father passed April 03, 2019; still helping to care for her mother.  Patient Active Problem List    Diagnosis Date Noted  . Leukopenia 10/01/2018  . Sleep apnea 10/01/2018  . 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 Colon cancer screening   . Anxiety and depression 06/28/2018  . Dysphagia 06/28/2018  . Elevated LDL cholesterol level 06/28/2018  . Fatigue 06/28/2018  . Unspecified disturbances of smell and taste 06/28/2018  . History of anal fissures 06/28/2018  . Endometriosis of uterus 06/14/2017  . 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 Hypertension 04/05/2017  . 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 Abdominal hernia 04/01/2012  . LGSIL Pap smear of vagina 01/03/2012  . Anemia complicating pregnancy in first trimester 04/07/2011  . 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 Lupus anticoagulant positive 11/07/2010    Past Surgical History:  Procedure Laterality Date  . ANAL FISSURE REPAIR  2008  . COLONOSCOPY WITH PROPOFOL N/A 07/11/2018   Procedure: COLONOSCOPY WITH PROPOFOL;  Surgeon: Lin Landsman, MD;  Location: [facility] END[facility];  Service: Gastroenterology;  Laterality: N/A;  . EXCISIONAL HEMORRHOIDECTOMY  [id number]   Bleeding complication  . HERNIA REPAIR      Family History  Problem Relation Age of Onset  . Stroke Mother        late 58s  . Hypertension Mother   . Lupus Cousin   . Breast cancer Cousin 108  . Cancer Father        Prostate  . Healthy Brother   . Hypertension Maternal Grandmother   . Heart attack Maternal Grandmother        before age 2  . Cancer Maternal Grandfather        Throat  . Hypertension Paternal Grandmother   . Cancer Paternal Grandmother        Jaw  . Stroke Paternal Grandfather        before age 34  . Hypertension Brother   . Healthy Son   . Breast cancer Cousin 11    Social History   Socioeconomic History  . Marital status: Single    Spouse name: Not on file  . Number of children: 1  . Years of education: Not on file  . Highest education level: Not on file  Occupational History  . Occupation:  Pharmacist, hospital    Comment: [employer]  Tobacco Use  . Smoking status: Never Smoker  . Smokeless tobacco: Never Used  Substance and Sexual Activity  . Alcohol use: No  . Drug use: No  . Sexual activity: Yes    Partners: Male    Birth control/protection: Patch  Other Topics Concern  . Not on file  Social History Narrative  . Not on file   Social Determinants of Health   Financial Resource Strain:   . Difficulty of Paying Living Expenses: Not on file  Food Insecurity:   . Worried About Charity fundraiser in the Last Year: Not on file  . Ran Out of Food in the Last Year: Not on file  Transportation Needs:   . Lack of Transportation (Medical): Not on  file  . Lack of Transportation (Non-Medical): Not on file  Physical Activity:   . Days of Exercise per Week: Not on file  . Minutes of Exercise per Session: Not on file  Stress:   . Feeling of Stress : Not on file  Social Connections:   . Frequency of Communication with Friends and Family: Not on file  . Frequency of Social Gatherings with Friends and Family: Not on file  . Attends Religious Services: Not on file  . Active Member of Clubs or Organizations: Not on file  . Attends Archivist Meetings: Not on file  . Marital Status: Not on file  Intimate Partner Violence:   . Fear of Current or Ex-Partner: Not on file  . Emotionally Abused: Not on file  . Physically Abused: Not on file  . Sexually Abused: Not on file     Current Outpatient Medications:  .  hydrochlorothiazide (HYDRODIURIL) 12.5 MG tablet, Take 1 tablet (12.5 mg total) by mouth daily., Disp: 90 tablet, Rfl: 0 .  lamoTRIgine (LAMICTAL) 100 MG tablet, Take 100 mg by mouth daily., Disp: , Rfl:  .  omeprazole (PRILOSEC) 20 MG capsule, Take 20 mg by mouth daily as needed., Disp: , Rfl:   Allergies  Allergen Reactions  . Biotin Hives and Palpitations  . Nylon Itching    I personally reviewed active problem list, medication list, allergies, notes from last  encounter, lab results with the patient/caregiver today.   ROS  Constitutional: Negative for fever or weight change.  Respiratory: Negative for cough and shortness of breath.   Cardiovascular: Negative for chest pain or palpitations.  Gastrointestinal: Negative for abdominal pain, no bowel changes.  Musculoskeletal: Negative for gait problem or joint swelling.  Skin: Negative for rash.  Neurological: Negative for dizziness or headache.  No other specific complaints in a complete review of systems (except as listed in HPI above).  Objective  Virtual encounter, vitals not obtained.  There is no height or weight on file to calculate BMI.  Physical Exam  Constitutional: Patient appears well-developed and well-nourished. No distress.  HENT: Head: Normocephalic and atraumatic.  Neck: Normal range of motion. Pulmonary/Chest: Effort normal. No respiratory distress. Speaking in complete sentences Neurological: Pt is alert and oriented to person, place, and time. Coordination, speech are normal.  Psychiatric: Patient has a normal mood and affect. behavior is normal. Judgment and thought content normal.   No results found for this or any previous visit (from the past 72 hour(s)).  PHQ2/9: Depression screen [facility] 2/9 09/05/2019 05/05/2019 02/27/2019 02/21/2019 02/21/2019  Decreased Interest 3 0 0 0 0  Down, Depressed, Hopeless 3 0 1 0 0  PHQ - 2 Score 6 0 1 0 0  Altered sleeping 3 1 1  0 0  Tired, decreased energy 2 0 1 0 0  Change in appetite 1 0 0 0 0  Feeling bad or failure about yourself  3 0 0 0 0  Trouble concentrating 3 0 1 0 0  Moving slowly or fidgety/restless 1 0 0 0 0  Suicidal thoughts 1 0 0 0 0  PHQ-9 Score 20 1 4  0 0  Difficult doing work/chores Very difficult Not difficult at all Somewhat difficult Not difficult at all Not difficult at all  Some recent data might be hidden   PHQ-2/9 Result is positive.    Fall Risk: Fall Risk  09/05/2019 05/05/2019 02/21/2019 01/31/2019  11/25/2018  Falls in the past year? 0 0 0 0 0  Number falls  in past yr: 0 0 0 0 0  Injury with Fall? 0 0 0 0 0  Follow up Falls evaluation completed Falls evaluation completed - - -    Assessment & Plan  1. Essential hypertension - CMP per orders  2. Sleep apnea, unspecified type - Compliant with CPAP  3. Elevated LDL cholesterol level - Lipid panel per orders.  4. Platelet dysfunction (HCC) 5. Leukopenia, unspecified type 6. Anemia, unspecified type - CBC per orders.  7. Unspecified disturbances of smell and taste - Omeprazole PRN. Will call back in 1-2 weeks if epigastric discomfort is not improving with GERD diet and omeprazole.  8. Diverticular disease of colon - No recent flares  9. Anxiety and depression - Restarted  - Ambulatory referral to Chronic Care Management Services  10. Right hip pain - Ambulatory referral to Orthopedics - meloxicam (MOBIC) 15 MG tablet; Take 1 tablet (15 mg total) by mouth daily.  Dispense: 20 tablet; Refill: 0  I discussed the assessment and treatment plan with the patient. The patient was provided an opportunity to ask questions and all were answered. The patient agreed with the plan and demonstrated an understanding of the instructions.   The patient was advised to call back or seek an in-person evaluation if the symptoms worsen or if the condition fails to improve as anticipated.  I provided 23 minutes of non-face-to-face time during this encounter.  Hubbard Hartshorn, FNP

## 2019-09-08 ENCOUNTER — Ambulatory Visit: Payer: Self-pay | Admitting: *Deleted

## 2019-09-08 NOTE — Chronic Care Management (AMB) (Signed)
    Care Management   Unsuccessful Call Note 09/08/2019 Name: Desiree [name] MRN: [id number] DOB: [birth year]  Patient  is a 48 year old female who sees Raelyn Ensign, FNP for primary care. Raelyn Ensign, FNP asked the CCM team to consult the patient for Mental Health Counseling and Resources.  Referral was placed 09/05/19. Patient's last office visit was 09/05/19.     This social worker was unable to reach patient via telephone today for follow up call to provide mental health resources. I have left HIPAA compliant voicemail asking patient to return my call. (unsuccessful outreach #1).   Plan: Will follow-up within 7 business days via telephone.     Elliot Gurney, [facility] Administrator, [facility] Care Management [phone]

## 2019-09-12 DIAGNOSIS — M25551 Pain in right hip: Secondary | ICD-10-CM | POA: Diagnosis not present

## 2019-09-12 DIAGNOSIS — M25851 Other specified joint disorders, right hip: Secondary | ICD-10-CM | POA: Diagnosis not present

## 2019-09-12 DIAGNOSIS — E7800 Pure hypercholesterolemia, unspecified: Secondary | ICD-10-CM | POA: Diagnosis not present

## 2019-09-12 DIAGNOSIS — D691 Qualitative platelet defects: Secondary | ICD-10-CM | POA: Diagnosis not present

## 2019-09-12 DIAGNOSIS — I10 Essential (primary) hypertension: Secondary | ICD-10-CM | POA: Diagnosis not present

## 2019-09-13 LAB — BASIC METABOLIC PANEL WITH GFR
BUN: 9 mg/dL (ref 7–25)
CO2: 28 mmol/L (ref 20–32)
Calcium: 9.8 mg/dL (ref 8.6–10.2)
Chloride: 105 mmol/L (ref 98–110)
Creat: 0.74 mg/dL (ref 0.50–1.10)
GFR, Est African American: 111 mL/min/{1.73_m2} (ref >=60)
GFR, Est Non African American: 96 mL/min/{1.73_m2} (ref >=60)
Glucose, Bld: 85 mg/dL (ref 65–99)
Potassium: 4.1 mmol/L (ref 3.5–5.3)
Sodium: 142 mmol/L (ref 135–146)

## 2019-09-13 LAB — CBC WITH DIFFERENTIAL/PLATELET
Absolute Monocytes: 408 cells/uL (ref 200–950)
Basophils Absolute: 19 cells/uL (ref 0–200)
Basophils Relative: 0.4 %
Eosinophils Absolute: 29 cells/uL (ref 15–500)
Eosinophils Relative: 0.6 %
HCT: 36.1 % (ref 35.0–45.0)
Hemoglobin: 11.4 g/dL — ABNORMAL LOW (ref 11.7–15.5)
Lymphs Abs: 1416 cells/uL (ref 850–3900)
MCH: 25.2 pg — ABNORMAL LOW (ref 27.0–33.0)
MCHC: 31.6 g/dL — ABNORMAL LOW (ref 32.0–36.0)
MCV: 79.7 fL — ABNORMAL LOW (ref 80.0–100.0)
MPV: 10.1 fL (ref 7.5–12.5)
Monocytes Relative: 8.5 %
Neutro Abs: 2928 cells/uL (ref 1500–7800)
Neutrophils Relative %: 61 %
Platelets: 335 10*3/uL (ref 140–400)
RBC: 4.53 10*6/uL (ref 3.80–5.10)
RDW: 13.2 % (ref 11.0–15.0)
Total Lymphocyte: 29.5 %
WBC: 4.8 10*3/uL (ref 3.8–10.8)

## 2019-09-13 LAB — LIPID PANEL
Cholesterol: 162 mg/dL (ref <200)
HDL: 54 mg/dL (ref >=50)
LDL Cholesterol (Calc): 95 mg/dL (calc)
Non-HDL Cholesterol (Calc): 108 mg/dL (calc) (ref <130)
Total CHOL/HDL Ratio: 3 (calc) (ref <5.0)
Triglycerides: 52 mg/dL (ref <150)

## 2019-09-25 ENCOUNTER — Ambulatory Visit: Payer: Self-pay | Admitting: *Deleted

## 2019-09-25 DIAGNOSIS — F329 Major depressive disorder, single episode, unspecified: Secondary | ICD-10-CM

## 2019-09-25 DIAGNOSIS — F419 Anxiety disorder, unspecified: Secondary | ICD-10-CM

## 2019-09-26 NOTE — Chronic Care Management (AMB) (Signed)
   Care Management    Clinical Social Work Follow Up Note  09/26/2019 Name: Desiree [name] MRN: [id number] DOB: 01-02-1971  Desiree [name] is a 49 y.o. year old female who is a primary care patient of Hubbard Hartshorn, FNP. The CCM team was consulted for assistance with Mental Health Counseling and Resources.   Review of patient status, including review of consultants reports, other relevant assessments, and collaboration with appropriate care team members and the patient's provider was performed as part of comprehensive patient evaluation and provision of chronic care management services.     Advanced Directives Status: <no information> See Care Plan for related entries.   Outpatient Encounter Medications as of 09/25/2019  Medication Sig  . hydrochlorothiazide (HYDRODIURIL) 12.5 MG tablet Take 1 tablet (12.5 mg total) by mouth daily.  Marland Kitchen lamoTRIgine (LAMICTAL) 100 MG tablet Take 100 mg by mouth daily.  . meloxicam (MOBIC) 15 MG tablet Take 1 tablet (15 mg total) by mouth daily.  Marland Kitchen omeprazole (PRILOSEC) 20 MG capsule Take 20 mg by mouth daily as needed.   No facility-administered encounter medications on file as of 09/25/2019.     Goals Addressed            This Visit's Progress   . "I need to find a new mental health therapist" (pt-stated)       Current Barriers:  . Depression related to loss of father and additional life circumstances . Mental Health Concerns  . Suicidal Ideation/Homicidal Ideation: No  Clinical Social Work Goal(s):  Marland Kitchen Over the next 90 days, patient will work with SW bi-weekly by telephone or in person to reduce or manage symptoms related to depression by connecting patient to a local mental health provider for ongoing follow up .   Interventions: . Patient interviewed and appropriate assessments performed: brief mental health assessment . Provided patient with information about local mental health agencies including the [facility] and [facility] to possibly follow up with  . Discussed plans with patient for ongoing care management follow up and provided patient with direct contact information for care management team . Provided patient with emotional support and supportive counseling related to the loss of her father  Patient Self Care Activities:  . Performs ADL's independently . Performs IADL's independently . Calls provider office for new concerns or questions . Motivation for treatment  Patient Coping Strengths:  . Family . Able to Communicate Effectively  Patient Self Care Deficits:  . Unable to identify in network mental health providers for ongoing mental health follow up  Initial goal documentation         Follow Up Plan: SW will follow up with patient by phone over the next 2 weeks   [place], [place] Worker  [facility] Care Management [phone]

## 2019-09-26 NOTE — Patient Instructions (Signed)
Thank you allowing the Chronic Care Management Team to be a part of your care! It was a pleasure speaking with you today!  1. Please follow up with me regarding your choice in mental health follow up and the appropriate referral will be made on your behalf  CCM (Chronic Care Management) Team   Neldon Labella RN, BSN Nurse Care Coordinator  [phone]  Ruben Reason PharmD  Clinical Pharmacist  [phone]   [place], [place] Social Worker [phone]  Goals Addressed            This Visit's Progress   . "I need to find a new mental health therapist" (pt-stated)       Current Barriers:  . Depression related to loss of father and additional life circumstances . Mental Health Concerns  . Suicidal Ideation/Homicidal Ideation: No  Clinical Social Work Goal(s):  Marland Kitchen Over the next 90 days, patient will work with SW bi-weekly by telephone or in person to reduce or manage symptoms related to depression by connecting patient to a local mental health provider for ongoing follow up .   Interventions: . Patient interviewed and appropriate assessments performed: brief mental health assessment . Provided patient with information about local mental health agencies including the [facility] and [facility] to possibly follow up with  . Discussed plans with patient for ongoing care management follow up and provided patient with direct contact information for care management team . Provided patient with emotional support and supportive counseling related to the loss of her father  Patient Self Care Activities:  . Performs ADL's independently . Performs IADL's independently . Calls provider office for new concerns or questions . Motivation for treatment  Patient Coping Strengths:  . Family . Able to Communicate Effectively  Patient Self Care Deficits:  . Unable to identify in network mental health providers for ongoing mental health follow up  Initial  goal documentation         The patient verbalized understanding of instructions provided today and declined a print copy of patient instruction materials.   The care management team will reach out to the patient again over the next 14 days.

## 2019-09-30 ENCOUNTER — Ambulatory Visit: Payer: Self-pay | Admitting: *Deleted

## 2019-09-30 DIAGNOSIS — F419 Anxiety disorder, unspecified: Secondary | ICD-10-CM

## 2019-09-30 DIAGNOSIS — F329 Major depressive disorder, single episode, unspecified: Secondary | ICD-10-CM

## 2019-09-30 NOTE — Chronic Care Management (AMB) (Signed)
Care Management    Clinical Social Work Follow Up Note  09/30/2019 Name: Desiree [name] MRN: [id number] DOB: April 24, 1971  Desiree [name] is a 49 y.o. year old female who is a primary care patient of Desiree Hartshorn, FNP. The CCM team was consulted for assistance with Mental Health Counseling and Resources.   Review of patient status, including review of consultants reports, other relevant assessments, and collaboration with appropriate care team members and the patient's provider was performed as part of comprehensive patient evaluation and provision of chronic care management services.    Advanced Directives Status: <no information> See Care Plan for related entries.   Outpatient Encounter Medications as of 09/30/2019  Medication Sig  . hydrochlorothiazide (HYDRODIURIL) 12.5 MG tablet Take 1 tablet (12.5 mg total) by mouth daily.  Marland Kitchen lamoTRIgine (LAMICTAL) 100 MG tablet Take 100 mg by mouth daily.  . meloxicam (MOBIC) 15 MG tablet Take 1 tablet (15 mg total) by mouth daily.  Marland Kitchen omeprazole (PRILOSEC) 20 MG capsule Take 20 mg by mouth daily as needed.   No facility-administered encounter medications on file as of 09/30/2019.     Goals Addressed            This Visit's Progress   . "I am exhausted with the care for my father"-completed (pt-stated)       Current Barriers:  . Limited social support . Family and relationship dysfunction  Clinical Social Work Clinical Goal(s):  Marland Kitchen Over the next 30 days, client will work with SW to address concerns related to caregiver strain  Interventions: . Patient interviewed and appropriate assessments performed .  Collaboration call to [facility] confirmed ability to service patient's in [place], however did not have a contract with the [facility] only [place] at this time. They would just need to call the [place], [place] to ask if they would send them an authorization to provide in home care. . Confirmed that patient has followed up  with the Department of [facility] and patient has been approved for home care services through [employer] using patient's Aid and Attendance benefit . Patient's mother's reluctance to receive help discussed, however patient understands the need and benefit to accepting the help and plans to follow through with coordinating the in home assistance.  . Patient encouraged to call this social worker with any additional questions or concerns regarding her in home care needs.     Patient Self Care Activities:  . Self administers medications as prescribed . Attends all scheduled provider appointments . Calls provider office for new concerns or questions  Please see past updates related to this goal by clicking on the "Past Updates" button in the selected goal      . "I need to find a new mental health therapist" (pt-stated)       Current Barriers:  . Depression related to loss of father and additional life circumstances . Mental Health Concerns  . Suicidal Ideation/Homicidal Ideation: No  Clinical Social Work Goal(s):  Marland Kitchen Over the next 90 days, patient will work with SW bi-weekly by telephone or in person to reduce or manage symptoms related to depression by connecting patient to a local mental health provider for ongoing follow up .   Interventions:  . Provided patient with information about the [facility]. Patient informed that [facility] is not taking new patients . Confirmed with patient that she would like to receive both therapy and medication management at one clinic . Collaboration phone  call to [facility] to confirm that they do have medication management services available but no mental health therapist available at this time . Collaboration phone call to the [facility] who confirmed that they have providers that will provide both medication management and mental health therapy . Patient agreed to referral to the [facility]-referral completed. They will contact patient to schedule initial appointment . Discussed plans with patient for ongoing care management follow up and provided patient with direct contact information for care management team   Patient Self Care Activities:  . Performs ADL's independently . Performs IADL's independently . Calls provider office for new concerns or questions . Motivation for treatment  Patient Coping Strengths:  . Family . Able to Communicate Effectively  Patient Self Care Deficits:  . Unable to identify in network mental health providers for ongoing mental health follow up  Please see past updates related to this goal by clicking on the "Past Updates" button in the selected goal          Follow Up Plan: SW will follow up with patient by phone over the next 2 weeks to follow upon referral to the [facility] Care Management [phone]

## 2019-09-30 NOTE — Patient Instructions (Signed)
Thank you allowing the Chronic Care Management Team to be a part of your care! It was a pleasure speaking with you today!  1. Please anticipate a phone call from the [facility] to schedule your initial intake appointment  CCM (Chronic Care Management) Team   Neldon Labella  RN, BSN Nurse Care Coordinator  [phone]  Ruben Reason PharmD  Clinical Pharmacist  [phone]   [place], LCSW Clinical Social Worker [phone]  Goals Addressed            This Visit's Progress   . "I am exhausted with the care for my father"-completed (pt-stated)       Current Barriers:  . Limited social support . Family and relationship dysfunction  Clinical Social Work Clinical Goal(s):  Marland Kitchen Over the next 30 days, client will work with SW to address concerns related to caregiver strain  Interventions: . Patient interviewed and appropriate assessments performed .  Collaboration call to [facility] confirmed ability to service patient's in [place], however did not have a contract with the [facility] only [place] at this time. They would just need to call the [place], [place] to ask if they would send them an authorization to provide in home care. . Confirmed that patient has followed up with the Department of [facility] and patient has been approved for home care services through [employer] using patient's Aid and Attendance benefit . Patient's mother's reluctance to receive help discussed, however patient understands the need and benefit to accepting the help and plans to follow through with coordinating the in home assistance.  . Patient encouraged to call this social worker with any additional questions or concerns regarding her in home care needs.     Patient Self Care Activities:  . Self administers medications as prescribed . Attends all scheduled provider appointments . Calls provider office for new concerns or questions  Please see past updates  related to this goal by clicking on the "Past Updates" button in the selected goal      . "I need to find a new mental health therapist" (pt-stated)       Current Barriers:  . Depression related to loss of father and additional life circumstances . Mental Health Concerns  . Suicidal Ideation/Homicidal Ideation: No  Clinical Social Work Goal(s):  Marland Kitchen Over the next 90 days, patient will work with SW bi-weekly by telephone or in person to reduce or manage symptoms related to depression by connecting patient to a local mental health provider for ongoing follow up .   Interventions:  . Provided patient with information about the [facility]. Patient informed that [facility] is not taking new patients . Confirmed with patient that she would like to receive both therapy and medication management at one clinic . Collaboration phone call to [place] to confirm that they do have medication management services available but no mental health therapist available at this time . Collaboration phone call to the [facility] who confirmed that they have providers that will provide both medication management and mental health therapy . Patient agreed to referral to the [facility]-referral completed. They will contact patient to schedule initial appointment . Discussed plans with patient for ongoing care management follow up and provided patient with direct contact information for care management team   Patient Self Care Activities:  . Performs ADL's independently . Performs IADL's independently . Calls provider office for new concerns or questions . Motivation for treatment  Patient Coping Strengths:  . Family . Able to Communicate Effectively  Patient Self Care Deficits:  . Unable to identify in network mental health providers for ongoing mental health follow up  Please see past updates related to this goal by clicking on the "Past Updates" button in the  selected goal          The patient verbalized understanding of instructions provided today and declined a print copy of patient instruction materials.   The care management team will reach out to the patient again over the next 7-14 business  days. to follow up on referral to the [facility]

## 2019-10-14 ENCOUNTER — Ambulatory Visit: Payer: Self-pay | Admitting: *Deleted

## 2019-10-14 DIAGNOSIS — F329 Major depressive disorder, single episode, unspecified: Secondary | ICD-10-CM

## 2019-10-14 DIAGNOSIS — F419 Anxiety disorder, unspecified: Secondary | ICD-10-CM

## 2019-10-14 NOTE — Patient Instructions (Signed)
Thank you allowing the Chronic Care Management Team to be a part of your care! It was a pleasure speaking with you today!  1. Please follow up with the [facility] for ongoing mental health counseling  CCM (Chronic Care Management) Team   Neldon Labella RN, BSN Nurse Care Coordinator  [phone]  Ruben Reason PharmD  Clinical Pharmacist  [phone]   [place], LCSW Clinical Social Worker [phone]  Goals Addressed            This Visit's Progress   . "I need to find a new mental health therapist"-completed (pt-stated)       Current Barriers:  . Depression related to loss of father and additional life circumstances . Mental Health Concerns  . Suicidal Ideation/Homicidal Ideation: No  Clinical Social Work Goal(s):  Marland Kitchen Over the next 90 days, patient will work with SW bi-weekly by telephone or in person to reduce or manage symptoms related to depression by connecting patient to a local mental health provider for ongoing follow up .   Interventions:  . Followed up with patient regarding referral for outpatient therapy with the [facility]. . Confirmed that patient has been contacted by them and intake paperwork completed . Contact number provided for the [facility] for further follow up if needed . Discussed plans with patient for ongoing care management follow up with the [facility] and provided patient with direct contact information for the [facility] and care management team if needed in the future. . Confirmed with patient that she has no additional community resource needs at this time.   Patient Self Care Activities:  . Performs ADL's independently . Performs IADL's independently . Calls provider office for new concerns or questions . Motivation for treatment  Patient Coping Strengths:  . Family . Able to Communicate Effectively  Patient Self Care Deficits:  . Unable to identify in network mental health providers  for ongoing mental health follow up  Please see past updates related to this goal by clicking on the "Past Updates" button in the selected goal          The patient verbalized understanding of instructions provided today and declined a print copy of patient instruction materials.   No further follow up required: Patient to follow up with the [facility] for ongoing outpatient follow up

## 2019-10-14 NOTE — Chronic Care Management (AMB) (Signed)
   Care Management    Clinical Social Work Follow Up Note  10/14/2019 Name: Desiree [name] MRN: [id number] DOB: 1971-05-27  Desiree [name] is a 49 y.o. year old female who is a primary care patient of Desiree Hartshorn, FNP. The CCM team was consulted for assistance with Mental Health Counseling and Resources.   Review of patient status, including review of consultants reports, other relevant assessments, and collaboration with appropriate care team members and the patient's provider was performed as part of comprehensive patient evaluation and provision of chronic care management services.    Advanced Directives Status: <no information> See Care Plan for related entries.   Outpatient Encounter Medications as of 10/14/2019  Medication Sig  . hydrochlorothiazide (HYDRODIURIL) 12.5 MG tablet Take 1 tablet (12.5 mg total) by mouth daily.  Marland Kitchen lamoTRIgine (LAMICTAL) 100 MG tablet Take 100 mg by mouth daily.  . meloxicam (MOBIC) 15 MG tablet Take 1 tablet (15 mg total) by mouth daily.  Marland Kitchen omeprazole (PRILOSEC) 20 MG capsule Take 20 mg by mouth daily as needed.   No facility-administered encounter medications on file as of 10/14/2019.     Goals Addressed            This Visit's Progress   . "I need to find a new mental health therapist"-completed (pt-stated)       Current Barriers:  . Depression related to loss of father and additional life circumstances . Mental Health Concerns  . Suicidal Ideation/Homicidal Ideation: No  Clinical Social Work Goal(s):  Marland Kitchen Over the next 90 days, patient will work with SW bi-weekly by telephone or in person to reduce or manage symptoms related to depression by connecting patient to a local mental health provider for ongoing follow up .   Interventions:  . Followed up with patient regarding referral for outpatient therapy with the [facility]. . Confirmed that patient has been contacted by them and intake paperwork completed . Contact number  provided for the [facility] for further follow up if needed . Discussed plans with patient for ongoing care management follow up with the [facility] and provided patient with direct contact information for the [facility] and care management team if needed in the future. . Confirmed with patient that she has no additional community resource needs at this time.   Patient Self Care Activities:  . Performs ADL's independently . Performs IADL's independently . Calls provider office for new concerns or questions . Motivation for treatment  Patient Coping Strengths:  . Family . Able to Communicate Effectively  Patient Self Care Deficits:  . Unable to identify in network mental health providers for ongoing mental health follow up  Please see past updates related to this goal by clicking on the "Past Updates" button in the selected goal          Follow Up Plan: Client will follow up with the [facility] for ogoing mental health counseling    Elliot Gurney, [place] Worker  [facility] Care Management [phone]

## 2019-10-24 DIAGNOSIS — M7551 Bursitis of right shoulder: Secondary | ICD-10-CM | POA: Diagnosis not present

## 2019-10-24 DIAGNOSIS — M25511 Pain in right shoulder: Secondary | ICD-10-CM | POA: Diagnosis not present

## 2019-10-24 DIAGNOSIS — G8929 Other chronic pain: Secondary | ICD-10-CM | POA: Diagnosis not present

## 2019-10-24 DIAGNOSIS — M19011 Primary osteoarthritis, right shoulder: Secondary | ICD-10-CM | POA: Diagnosis not present

## 2019-10-24 DIAGNOSIS — F331 Major depressive disorder, recurrent, moderate: Secondary | ICD-10-CM | POA: Diagnosis not present

## 2019-10-24 DIAGNOSIS — M7541 Impingement syndrome of right shoulder: Secondary | ICD-10-CM | POA: Diagnosis not present

## 2019-11-14 DIAGNOSIS — F331 Major depressive disorder, recurrent, moderate: Secondary | ICD-10-CM | POA: Diagnosis not present

## 2019-11-20 ENCOUNTER — Telehealth: Payer: Self-pay

## 2019-11-20 DIAGNOSIS — F331 Major depressive disorder, recurrent, moderate: Secondary | ICD-10-CM | POA: Diagnosis not present

## 2019-11-20 NOTE — Telephone Encounter (Signed)
Received CMN from [facility] for cpap machine. Pt last seen Dr. Juanell Fairly in 2019, and does not currently have a pending ov.  Pt will need an OV prior to CMN being signed. Pt has been scheduled for OV with Eric Form, NP on 12/10/2019. Nothing further is needed.

## 2019-11-28 DIAGNOSIS — F331 Major depressive disorder, recurrent, moderate: Secondary | ICD-10-CM | POA: Diagnosis not present

## 2019-12-04 DIAGNOSIS — F331 Major depressive disorder, recurrent, moderate: Secondary | ICD-10-CM | POA: Diagnosis not present

## 2019-12-10 ENCOUNTER — Ambulatory Visit (INDEPENDENT_AMBULATORY_CARE_PROVIDER_SITE_OTHER): Payer: Medicaid Other | Admitting: Acute Care

## 2019-12-10 ENCOUNTER — Encounter: Payer: Self-pay | Admitting: Acute Care

## 2019-12-10 ENCOUNTER — Other Ambulatory Visit: Payer: Self-pay

## 2019-12-10 VITALS — BP 126/74 | HR 67 | Temp 98.0°F | Ht 63.0 in | Wt 176.8 lb

## 2019-12-10 DIAGNOSIS — Z9989 Dependence on other enabling machines and devices: Secondary | ICD-10-CM

## 2019-12-10 DIAGNOSIS — G4733 Obstructive sleep apnea (adult) (pediatric): Secondary | ICD-10-CM | POA: Diagnosis not present

## 2019-12-10 DIAGNOSIS — F419 Anxiety disorder, unspecified: Secondary | ICD-10-CM | POA: Diagnosis not present

## 2019-12-10 DIAGNOSIS — F329 Major depressive disorder, single episode, unspecified: Secondary | ICD-10-CM

## 2019-12-10 DIAGNOSIS — F32A Depression, unspecified: Secondary | ICD-10-CM

## 2019-12-10 NOTE — Progress Notes (Signed)
History of Present Illness Desiree [name] is a 49 y.o. female with OSA on CPAP. She was a previous patient of Dr. Ashby Dawes, and will now be followed by Dr. Mortimer Fries CPAP Auto Set 5-20 cm H2O  12/10/2019 Follow up for OSA and for completion of Medical Necessity form. Pt. Presents for follow up of her obstructive sleep apnea.. She states she has been compliant with her CPAP machine.  Download confirms this as compliance has been 100% this month.  She does still endorse some daytime sleepiness, however states is much better since starting CPAP therapy.  Download also confirms excellent control of OSA with AHI noted to be 0.9 events per hour.She does have a family member ( fraternal aunt) who has a history of Narcolepsy. We will see her in 3 months to evaluate her daytime sleepiness.   Test Results: Sleep Study 06/2018 Mild OSA with AHI of 12  Mild OSA with AHI of 12, accompanied by symptoms of excessive daytime sleepiness, morning headaches, disturbed sleep, insomnia with concomitant hypertension, depression  Epworth 18 She does have some new daytime sleepiness. Pt. Suspects her sleepiness is 2/2 her Zoloft. She was recently switched back to Zoloft because she had a suspected interaction with her previous antidepressant.  We discussed that it may be drug related as she has excellent compliance with her CPAP machine. She is planning on giving the Zoloft more time to see if she has less daytime sleepiness.   CPAP download 11/09/2019 through 12/08/2019 Air sense 10 AutoSet 5 to 20 cm H2O Usage days 30 of 30 or 100% Greater than 4 hours 27 days or 90% Less than 4 hours 3 days or 10% Average usage time 7 hours and 25 minutes Median pressure 8.2 AHI 0.9    CBC Latest Ref Rng & Units 09/12/2019 02/21/2019 10/21/2018  WBC 3.8 - 10.8 Thousand/uL 4.8 4.4 4.7  Hemoglobin 11.7 - 15.5 g/dL 11.4(L) 11.5(L) 10.9(L)  Hematocrit 35.0 - 45.0 % 36.1 34.8(L) 33.4(L)  Platelets 140 - 400 Thousand/uL 335  336 340    BMP Latest Ref Rng & Units 09/12/2019 06/28/2018 06/14/2017  Glucose 65 - 99 mg/dL 85 87 107(H)  BUN 7 - 25 mg/dL 9 6(L) 7  Creatinine 0.50 - 1.10 mg/dL 0.74 0.79 0.81  BUN/Creat Ratio 6 - 22 (calc) NOT APPLICABLE 8 -  Sodium A999333 - 146 mmol/L 142 141 139  Potassium 3.5 - 5.3 mmol/L 4.1 3.7 3.5  Chloride 98 - 110 mmol/L 105 103 100  CO2 20 - 32 mmol/L 28 30 31   Calcium 8.6 - 10.2 mg/dL 9.8 9.6 9.7    BNP No results found for: BNP  ProBNP No results found for: PROBNP  PFT No results found for: FEV1PRE, FEV1POST, FVCPRE, FVCPOST, TLC, DLCOUNC, PREFEV1FVCRT, PSTFEV1FVCRT  No results found.   Past medical hx Past Medical History:  Diagnosis Date  . Abnormal Pap smear   . Abuse    h/o  . Allergy   . Anemia   . Arm fracture   . 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 Breast lesion    left breast-h/o  . Depression    h/o  . H/O lupus anticoagulant disorder   . Headache(784.0)   . History of chicken pox   . HPV in female   . Humerus fracture 03/2011   right   . Hx of colposcopy with cervical biopsy 03/13/11  . Hx of migraines   . Hypertension   .  LGSIL (low grade squamous intraepithelial dysplasia)    h/o  . Ovarian cyst, left   . Pelvic pain complicating pregnancy    h/o  . Rhinitis, chronic   . Sleep apnea   . Varicose veins    h/o     Social History   Tobacco Use  . Smoking status: Never Smoker  . Smokeless tobacco: Never Used  Substance Use Topics  . Alcohol use: No  . Drug use: No    Ms.Schuldt reports that she has never smoked. She has never used smokeless tobacco. She reports that she does not drink alcohol or use drugs.  Tobacco Cessation: Never smoker  Past surgical hx, Family hx, Social hx all reviewed.  Current Outpatient Medications on File Prior to Visit  Medication Sig  . ZOLOFT 25 MG tablet Take 25 mg by mouth daily.   No current facility-administered medications on file  prior to visit.     Allergies  Allergen Reactions  . Biotin Hives and Palpitations  . Nylon Itching    Review Of Systems:  Constitutional:   No  weight loss, night sweats,  Fevers, chills, fatigue, or  lassitude.  HEENT:   No headaches,  Difficulty swallowing,  Tooth/dental problems, or  Sore throat,                No sneezing, itching, ear ache, nasal congestion, post nasal drip,   CV:  No chest pain,  Orthopnea, PND, swelling in lower extremities, anasarca, dizziness, palpitations, syncope.   GI  No heartburn, indigestion, abdominal pain, nausea, vomiting, diarrhea, change in bowel habits, loss of appetite, bloody stools.   Resp: No shortness of breath with exertion or at rest.  No excess mucus, no productive cough,  No non-productive cough,  No coughing up of blood.  No change in color of mucus.  No wheezing.  No chest wall deformity  Skin: no rash or lesions.  GU: no dysuria, change in color of urine, no urgency or frequency.  No flank pain, no hematuria   MS:  No joint pain or swelling.  No decreased range of motion.  No back pain.  Psych:  No change in mood or affect. No depression or anxiety.  No memory loss.   Vital Signs Pulse 67   LMP 12/19/2015   SpO2 98%    Physical Exam:  General- No distress,  A&Ox3, pleasant ENT: No sinus tenderness, TM clear, pale nasal mucosa, no oral exudate,no post nasal drip, no LAN Cardiac: S1, S2, regular rate and rhythm, no murmur Chest: No wheeze/ rales/ dullness; no accessory muscle use, no nasal flaring, no sternal retractions Abd.: Soft Non-tender, ND, BS + Ext: No clubbing cyanosis, edema Neuro:  normal strength, MAE x 4, A&O x 3 Skin: No rashes, warm and dry Psych: normal mood and behavior   Assessment/Plan  OSA on CPAP Moderate OSA per sleep study Excellent compliance AHI of 0.9 Continued daytime sleepiness that I feel is related to her depression and transition  of Lexapro to  Zoloft ( Changed medication 2/2   reaction of Lexapro to HCTZ) Plan Continue on CPAP at bedtime. You appear to be benefiting from the treatment  Goal is to wear for at least 6 hours each night for maximal clinical benefit. Continue to work on weight loss, as the link between excess weight  and sleep apnea is well established.   Remember to establish a good bedtime routine, and work on sleep hygiene.  Limit daytime naps , avoid stimulants  such as caffeine and nicotine close to bedtime, exercise daily to promote sleep quality, avoid heavy , spicy, fried , or rich foods before bed. Ensure adequate exposure to natural light during the day,establish a relaxing bedtime routine with a pleasant sleep environment ( Bedroom between 60 and 67 degrees, turn off bright lights , TV or device screens screens , consider black out curtains or white noise machines) Do not drive if sleepy. Remember to clean mask, tubing, filter, and reservoir once weekly with soapy water.  Follow up with Dr. Stoney Bang  In 3 year or before as needed.   We will complete [employer] request for prior approval CMN/PA paperwork   Depression Currently transitioning from Lexapro to Zoloft 2/2 drug interaction with HCTZ Plan Follow up with Psych for dose adjustment   Magdalen Spatz, NP 12/10/2019  2:34 PM

## 2019-12-10 NOTE — Patient Instructions (Addendum)
It is nice to meet you today. We will place an order for CPAP supplies. Your download shows that you are wearing your CPAP machine every night and that you have great compliance and great control of your sleep apnea. Keep up the great work Continue on CPAP at bedtime. You appear to be benefiting from the treatment  Goal is to wear for at least 6 hours each night for maximal clinical benefit. Continue to work on weight loss, as the link between excess weight  and sleep apnea is well established.   Remember to establish a good bedtime routine, and work on sleep hygiene.  Limit daytime naps , avoid stimulants such as caffeine and nicotine close to bedtime, exercise daily to promote sleep quality, avoid heavy , spicy, fried , or rich foods before bed. Ensure adequate exposure to natural light during the day,establish a relaxing bedtime routine with a pleasant sleep environment ( Bedroom between 60 and 67 degrees, turn off bright lights , TV or device screens screens , consider black out curtains or white noise machines) Do not drive if sleepy. Remember to clean mask, tubing, filter, and reservoir once weekly with soapy water.  Follow up with Dr. Mortimer Fries   In 3 months or before as needed.   We have completed the [employer] request for prior approval CMN/PA paperwork, and have faxed it back.   Please contact office for sooner follow up if symptoms do not improve or worsen or seek emergency care

## 2019-12-11 DIAGNOSIS — F331 Major depressive disorder, recurrent, moderate: Secondary | ICD-10-CM | POA: Diagnosis not present

## 2019-12-16 DIAGNOSIS — F331 Major depressive disorder, recurrent, moderate: Secondary | ICD-10-CM | POA: Diagnosis not present

## 2019-12-19 DIAGNOSIS — F331 Major depressive disorder, recurrent, moderate: Secondary | ICD-10-CM | POA: Diagnosis not present

## 2019-12-23 DIAGNOSIS — F331 Major depressive disorder, recurrent, moderate: Secondary | ICD-10-CM | POA: Diagnosis not present

## 2020-01-02 DIAGNOSIS — F331 Major depressive disorder, recurrent, moderate: Secondary | ICD-10-CM | POA: Diagnosis not present

## 2020-01-05 ENCOUNTER — Encounter: Payer: Self-pay | Admitting: Family Medicine

## 2020-01-08 DIAGNOSIS — F331 Major depressive disorder, recurrent, moderate: Secondary | ICD-10-CM | POA: Diagnosis not present

## 2020-01-13 NOTE — Progress Notes (Addendum)
Name: Desiree [name]   MRN: [id number]    DOB: [birth year]   Date:01/14/2020       Progress Note  Subjective  Chief Complaint  Chief Complaint  Patient presents with  . Fatigue    uses CPAP, still feels tired throughout the day, gets really tired and will have to stop an nap in the middle of the day    I connected with  Shayne Alken  on 01/14/20 at  8:20 AM EDT by a video enabled telemedicine application and verified that I am speaking with the correct person using two identifiers.  I discussed the limitations of evaluation and management by telemedicine and the availability of in person appointments. The patient expressed understanding and agreed to proceed. Staff also discussed with the patient that there may be a patient responsible charge related to this service. Patient Location: Home Provider Location: [facility] Additional Individuals present: none  HPI Patient is a 49 year old female patient of Raelyn Ensign She follows up today with complaints of feeling sleepy much of the day.  Notes still felling tired throughout the day. Was like that for years in her past, and it got to the point where there would often call her to make sure she was awake during the day and she was diagnosed with major depression and OSA. She noted today doing great with CPAP, with the last note with pulmonary reviewed:  12/10/2019 - Follow up for OSA and for completion of Medical Necessity form. She states she has been compliant with her CPAP machine.  Download confirms this as compliance has been 100% this month.  She does still endorse some daytime sleepiness, however states is much better since starting CPAP therapy.  Download also confirms excellent control of OSA with AHI noted to be 0.9 events per hour.She does have a family member ( fraternal aunt) who has a history of Narcolepsy.  Epworth score was 18 She does have some new daytime sleepiness. Pt. Suspects her sleepiness is 2/2 her Zoloft. She was  recently switched back to Zoloft because she had a suspected interaction with her previous antidepressant.  We discussed that it may be drug related as she has excellent compliance with her CPAP machine. She is planning on giving the Zoloft more time to see if she has less daytime sleepiness.  Plans were to see her again in 3 months to evaluate her daytime sleepiness.   She notes that she still struggles to fall asleep at times, Sometimes, wakes up a couple times overnight.  She notes that she yawns a lot during the day, often when trying to read a story to her son at night.  She has a family member with narcolepsy, although she has never been diagnosed with that.  Denies any frank drop attacks of sleep.  She was last seen by Raquel Sarna in December, with that note reviewed, and depression was one of the concerns addressed at that visit.  Depression:Her history previously on abilify; then off and was taking lexapro, but it caused pins and needles in her neck, so she was switched to Lamictal, then lost her medicaid and could not afford the medication or to follow up with psychiatry.  She restarted Lamictal 3 days prior to last visit with Raquel Sarna in Dec and was already noticing some improvement.  She then at one point was on Zoloft, and now is taking Lexapro again. She currently is followed by [facility] (Chronic care management was consulted to help with mental health  resources in the past, and it was concluded at the last visit in January that Client will follow up with the [facility] for ogoing mental health counseling), last saw last Thursday and helpful. Sees weekly with another visit planned tomorrow.  They are helpful with counseling, and also with the prescription medications. Now taking lexapro - 10mg  once daily past month and is feeling better since restarted, she takes no other medications presently except the Lexapro. She still has some down feelings at times, more decreased interest  noted, Denies SI/HI today Has her 21yo son at home with her.  Her last labs were reviewed, with a CBC in December 2020 noted -she was borderline anemic, although not different from prior CBCs, with just a slightly low MCV and MCH noted.  Her BMP and cholesterol panel were good.  Her last TSH check was in 10/19 and was normal.  Tobacco-never smoker Alcohol-denied use  Patient Active Problem List   Diagnosis Date Noted  . Leukopenia 10/01/2018  . Sleep apnea 10/01/2018  . 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 Colon cancer screening   . Anxiety and depression 06/28/2018  . Dysphagia 06/28/2018  . Elevated LDL cholesterol level 06/28/2018  . Fatigue 06/28/2018  . Unspecified disturbances of smell and taste 06/28/2018  . History of anal fissures 06/28/2018  . Endometriosis of uterus 06/14/2017  . 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 Hypertension 04/05/2017  . 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 Abdominal hernia 04/01/2012  . LGSIL Pap smear of vagina 01/03/2012  . Anemia 04/07/2011  . 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 Lupus anticoagulant positive 11/07/2010    Past Surgical History:  Procedure Laterality Date  . ANAL FISSURE REPAIR  2008  . COLONOSCOPY WITH PROPOFOL N/A 07/11/2018   Procedure: COLONOSCOPY WITH PROPOFOL;  Surgeon: Lin Landsman, MD;  Location: [facility] ENDOSCOPY;  Service: Gastroenterology;  Laterality: N/A;  . EXCISIONAL HEMORRHOIDECTOMY  [id number]   Bleeding complication  . HERNIA REPAIR      Family History  Problem Relation Age of Onset  . Stroke Mother        late 15s  . Hypertension Mother   . Lupus Cousin   . Breast cancer Cousin 51  . Cancer Father        Prostate  . Healthy Brother   . Hypertension Maternal Grandmother   . Heart attack Maternal Grandmother        before age 77  . Cancer Maternal Grandfather        Throat  . Hypertension Paternal Grandmother   . Cancer Paternal Grandmother        Jaw  .  Stroke Paternal Grandfather        before age 37  . Hypertension Brother   . Healthy Son   . Breast cancer Cousin 51    Social History   Tobacco Use  . Smoking status: Never Smoker  . Smokeless tobacco: Never Used  Substance Use Topics  . Alcohol use: No     Current Outpatient Medications:  .  escitalopram (LEXAPRO) 10 MG tablet, Take 10 mg by mouth daily. As needed, Disp: , Rfl:  .  Progesterone (PROMETRIUM PO), Take by mouth. As needed, Disp: , Rfl:  .  ZOLOFT 25 MG tablet, Take 25 mg by mouth daily., Disp: , Rfl:  Not taking progesterone  Not taking zoloft Only medicine taking Lexapro  Allergies  Allergen Reactions  . Biotin Hives and Palpitations  . Nylon Itching  With staff assistance, above reviewed with the patient today.   ROS: As per HPI, otherwise no specific complaints on a limited and focused system review   Objective  Virtual encounter, vitals not obtained.  Body mass index is 31.71 kg/m.  Physical Exam  Patient appears in NAD Breathing: Effort normal. No respiratory distress. Speaking in complete sentences Neurological: Pt is alert and oriented,  Speech is normal.  Psychiatric: Patient has a normal mood, affect slightly depressed, Very appropriate with conversation, judgment and thought content normal.   No results found for this or any previous visit (from the past 72 hour(s)).  PHQ2/9: Depression screen [facility] 2/9 01/14/2020 09/05/2019 05/05/2019 02/27/2019 02/21/2019  Decreased Interest 3 3 0 0 0  Down, Depressed, Hopeless 1 3 0 1 0  PHQ - 2 Score 4 6 0 1 0  Altered sleeping 3 3 1 1  0  Tired, decreased energy 3 2 0 1 0  Change in appetite 1 1 0 0 0  Feeling bad or failure about yourself  1 3 0 0 0  Trouble concentrating 3 3 0 1 0  Moving slowly or fidgety/restless 0 1 0 0 0  Suicidal thoughts 0 1 0 0 0  PHQ-9 Score 15 20 1 4  0  Difficult doing work/chores Very difficult Very difficult Not difficult at all Somewhat difficult Not difficult at  all  Some recent data might be hidden   PHQ-2/9 Result reviewed, seeing mental health providers weekly at present  Fall Risk: Fall Risk  01/14/2020 09/05/2019 05/05/2019 02/21/2019 01/31/2019  Falls in the past year? 1 0 0 0 0  Number falls in past yr: 0 0 0 0 0  Injury with Fall? 0 0 0 0 0  Follow up - Falls evaluation completed Falls evaluation completed - -     Assessment & Plan 1. Daytime sleepiness Patient notes has been more problematic again in the recent past, with her last visit with pulmonary in March having an Epworth score of 18 noted.  She has major depression and obstructive sleep apnea as contributors, with the sleep apnea helped with CPAP and doing pretty well with this per the last note by pulmonary.  She still has significant depression concerns, and notes is feeling a little better since starting the Lexapro again a month ago, and is seeing mental health providers as noted above.  She denied any suicidal or homicidal ideation today.  She still has some difficulty getting to sleep at times, and noted a prior medicine she was on was perhaps more helpful in that realm.  Discussed some concern with medicines to help sleep in the setting of her obstructive sleep apnea. Did mention a product called trazodone which may be helpful, and discussed adding that to her regimen today.  Mutually agreed to run this by her mental health providers tomorrow as she has another follow-up with them at that time, and if they agree, would initiate to take at nighttime, and if they can not provide the prescription, asked that she call and let us know and I can write that for her. Continuing the CPAP is important as well as following up with pulmonary as they planned 3 months from her last visit, and recommended she contact them and get an appointment to do so as she does not have one presently. Also discussed other potential sources, and would like to recheck labs again if this is continuing, making sure she  is not more anemic, checking her thyroid status again, and  a metabolic panel to make sure things are okay.  Tentatively to schedule a follow-up in a  4 to 6-week timeframe, and can be sooner as needed.  2. Obstructive sleep apnea syndrome As above, continue to see pulmonary and continue with the CPAP presently.  A diagnosis of narcolepsy does seem unlikely, and await continued input.  3. Current moderate episode of major depressive disorder, unspecified whether recurrent (HCC) PHQ-9 today was reviewed Do feel significant depression still exists and continue follow-up with her mental health care providers is important presently.  She sees them weekly and to continue. We will continue the Lexapro, and to consider adding trazodone as noted above, taking once nightly. Did discuss the risks and benefits of this medicine with her today.  4. Anemia, unspecified type History of mild anemia, no significant change on last check, and tentatively scheduled a follow-up to recheck again if symptoms are persisting and not improving, pending her status.  Follow-up with an in person visit in approximately 4 to 6 weeks.  I discussed the assessment and treatment plan with the patient. The patient was provided an opportunity to ask questions and all were answered. The patient agreed with the plan and demonstrated an understanding of the instructions.  The patient was advised to call back or seek an in-person evaluation if the symptoms worsen or if the condition fails to improve as anticipated.  I provided 25 minutes of non-face-to-face time during this encounter that included discussing at length patient's sx/history, pertinent pmhx, medications, treatment and follow up plan. This time also included the necessary documentation, orders, and chart review.

## 2020-01-14 ENCOUNTER — Other Ambulatory Visit: Payer: Self-pay

## 2020-01-14 ENCOUNTER — Ambulatory Visit (INDEPENDENT_AMBULATORY_CARE_PROVIDER_SITE_OTHER): Payer: Medicaid Other | Admitting: Internal Medicine

## 2020-01-14 ENCOUNTER — Encounter: Payer: Self-pay | Admitting: Internal Medicine

## 2020-01-14 VITALS — Ht 63.0 in | Wt 179.0 lb

## 2020-01-14 DIAGNOSIS — D649 Anemia, unspecified: Secondary | ICD-10-CM

## 2020-01-14 DIAGNOSIS — G4733 Obstructive sleep apnea (adult) (pediatric): Secondary | ICD-10-CM

## 2020-01-14 DIAGNOSIS — F321 Major depressive disorder, single episode, moderate: Secondary | ICD-10-CM | POA: Diagnosis not present

## 2020-01-14 DIAGNOSIS — R400 Somnolence: Secondary | ICD-10-CM | POA: Diagnosis not present

## 2020-01-15 DIAGNOSIS — F331 Major depressive disorder, recurrent, moderate: Secondary | ICD-10-CM | POA: Diagnosis not present

## 2020-01-19 DIAGNOSIS — Z1152 Encounter for screening for COVID-19: Secondary | ICD-10-CM | POA: Diagnosis not present

## 2020-01-19 DIAGNOSIS — R519 Headache, unspecified: Secondary | ICD-10-CM | POA: Diagnosis not present

## 2020-01-26 DIAGNOSIS — F331 Major depressive disorder, recurrent, moderate: Secondary | ICD-10-CM | POA: Diagnosis not present

## 2020-02-05 DIAGNOSIS — F331 Major depressive disorder, recurrent, moderate: Secondary | ICD-10-CM | POA: Diagnosis not present

## 2020-02-13 DIAGNOSIS — F331 Major depressive disorder, recurrent, moderate: Secondary | ICD-10-CM | POA: Diagnosis not present

## 2020-02-24 ENCOUNTER — Encounter: Payer: Medicaid Other | Admitting: Family Medicine

## 2020-02-24 DIAGNOSIS — F331 Major depressive disorder, recurrent, moderate: Secondary | ICD-10-CM | POA: Diagnosis not present

## 2020-02-25 ENCOUNTER — Encounter: Payer: Medicaid Other | Admitting: Internal Medicine

## 2020-03-09 DIAGNOSIS — F331 Major depressive disorder, recurrent, moderate: Secondary | ICD-10-CM | POA: Diagnosis not present

## 2020-03-23 DIAGNOSIS — F331 Major depressive disorder, recurrent, moderate: Secondary | ICD-10-CM | POA: Diagnosis not present

## 2020-03-30 DIAGNOSIS — F4310 Post-traumatic stress disorder, unspecified: Secondary | ICD-10-CM | POA: Diagnosis not present

## 2020-03-30 DIAGNOSIS — F411 Generalized anxiety disorder: Secondary | ICD-10-CM | POA: Diagnosis not present

## 2020-03-30 DIAGNOSIS — F331 Major depressive disorder, recurrent, moderate: Secondary | ICD-10-CM | POA: Diagnosis not present

## 2020-04-08 DIAGNOSIS — G4733 Obstructive sleep apnea (adult) (pediatric): Secondary | ICD-10-CM | POA: Diagnosis not present

## 2020-05-05 DIAGNOSIS — F331 Major depressive disorder, recurrent, moderate: Secondary | ICD-10-CM | POA: Diagnosis not present

## 2020-05-05 DIAGNOSIS — F4310 Post-traumatic stress disorder, unspecified: Secondary | ICD-10-CM | POA: Diagnosis not present

## 2020-05-05 DIAGNOSIS — F411 Generalized anxiety disorder: Secondary | ICD-10-CM | POA: Diagnosis not present

## 2020-05-12 DIAGNOSIS — F331 Major depressive disorder, recurrent, moderate: Secondary | ICD-10-CM | POA: Diagnosis not present

## 2020-05-12 DIAGNOSIS — F411 Generalized anxiety disorder: Secondary | ICD-10-CM | POA: Diagnosis not present

## 2020-05-12 DIAGNOSIS — F4310 Post-traumatic stress disorder, unspecified: Secondary | ICD-10-CM | POA: Diagnosis not present

## 2020-05-18 DIAGNOSIS — F4310 Post-traumatic stress disorder, unspecified: Secondary | ICD-10-CM | POA: Diagnosis not present

## 2020-05-18 DIAGNOSIS — F411 Generalized anxiety disorder: Secondary | ICD-10-CM | POA: Diagnosis not present

## 2020-05-18 DIAGNOSIS — F331 Major depressive disorder, recurrent, moderate: Secondary | ICD-10-CM | POA: Diagnosis not present

## 2020-05-26 DIAGNOSIS — F411 Generalized anxiety disorder: Secondary | ICD-10-CM | POA: Diagnosis not present

## 2020-05-26 DIAGNOSIS — F4310 Post-traumatic stress disorder, unspecified: Secondary | ICD-10-CM | POA: Diagnosis not present

## 2020-05-26 DIAGNOSIS — F331 Major depressive disorder, recurrent, moderate: Secondary | ICD-10-CM | POA: Diagnosis not present

## 2020-06-02 ENCOUNTER — Other Ambulatory Visit: Payer: Self-pay

## 2020-06-02 ENCOUNTER — Ambulatory Visit (INDEPENDENT_AMBULATORY_CARE_PROVIDER_SITE_OTHER): Payer: Medicaid Other | Admitting: Internal Medicine

## 2020-06-02 ENCOUNTER — Ambulatory Visit: Payer: Self-pay

## 2020-06-02 ENCOUNTER — Encounter: Payer: Self-pay | Admitting: Internal Medicine

## 2020-06-02 VITALS — BP 132/92 | HR 79 | Temp 98.0°F | Resp 16 | Ht 63.0 in | Wt 181.6 lb

## 2020-06-02 DIAGNOSIS — F4310 Post-traumatic stress disorder, unspecified: Secondary | ICD-10-CM | POA: Diagnosis not present

## 2020-06-02 DIAGNOSIS — T50Z95A Adverse effect of other vaccines and biological substances, initial encounter: Secondary | ICD-10-CM | POA: Diagnosis not present

## 2020-06-02 DIAGNOSIS — F331 Major depressive disorder, recurrent, moderate: Secondary | ICD-10-CM | POA: Diagnosis not present

## 2020-06-02 DIAGNOSIS — F411 Generalized anxiety disorder: Secondary | ICD-10-CM | POA: Diagnosis not present

## 2020-06-02 NOTE — Patient Instructions (Addendum)
As we discussed, I do think this was a local reaction to the vaccination. It is not a frank contraindication to getting the second vaccine, although I do understand the hesitancy in proceeding with that, and if it is desired not to get that second vaccination for Covid, I do think that is reasonable.  Would use cool compresses to the area topically  Also a Benadryl product-25 mg every 6 hours during the day, although may make drowsy and be careful taking during the day, and can take 50 mg at bedtime to help.

## 2020-06-02 NOTE — Telephone Encounter (Signed)
Pt. Reports she received Moderna vaccine 2 weeks ago. Still has swelling, redness, itching to left arm at injection site. No fever or other symptoms. Appointment for today.  Reason for Disposition . [1] Pain, tenderness, or swelling at the injection site AND [2] over 3 days (72 hours) since vaccine AND [3] getting worse  Answer Assessment - Initial Assessment Questions 1. MAIN CONCERN OR SYMPTOM:  "What is your main concern right now?" "What question do you have?" "What's the main symptom you're worried about?" (e.g., fever, pain, redness, swelling)     Swelling, itchy left arm 2. VACCINE: "What vaccination did you receive?" "Is this your first or second shot?" (e.g., none; AstraZeneca, J&J, [place], [place], other)     Moderna 3. SYMPTOM ONSET: "When did the swelling  begin?" (e.g., not relevant; hours, days)      Yesterday 4. SYMPTOM SEVERITY: "How bad is it?"      Moderate 5. FEVER: "Is there a fever?" If so, ask: "What is it, how was it measured, and when did it start?"      No 6. PAST REACTIONS: "Have you reacted to immunizations before?" If so, ask: "What happened?"     No 7. OTHER SYMPTOMS: "Do you have any other symptoms?"     No  Protocols used: CORONAVIRUS (COVID-19) VACCINE QUESTIONS AND REACTIONS-A-AH

## 2020-06-02 NOTE — Progress Notes (Signed)
[name], female    DOB: [birth year], 49 y.o.   MRN: [id number]  PCP: Towanda Malkin, MD  Chief Complaint  Patient presents with  . Arm Pain    Covid vaccine on 8/30, injection site on left arm is red swollen, hot to touch    Subjective:   Desiree [name] is a 49 y.o. female, presents to clinic with CC of the following:  Chief Complaint  Patient presents with  . Arm Pain    Covid vaccine on 8/30, injection site on left arm is red swollen, hot to touch    HPI:  Patient is a 49 year old female She contacted the triage nurse with the following:  Pt. Reports she received Moderna vaccine 2 weeks ago. Still has swelling, redness, itching to left arm at injection site. No fever or other symptoms. Appointment was made for today.  Son is with her today  she denied any history of previous reactions to immunizations.  She noted she got the Materna Covid vaccination last Monday, and later that day, noted the increased redness and swelling and discomfort at the site of the injection.  She notes that it became bigger than it is presently, lessened some, then seemed to have gotten a little bigger again.  It is uncomfortable in the area of redness, described as a "tightness "feeling in the left proximal upper extremity.  No pain goes down the arm, no numbness or tingling, not dropping things, no limitations in shoulder motion. She did have some nausea with one episode of vomiting the day after, questioned if felt achy, although noted some fatigue, and still feels a little fatigued. She question some throat irritation, perhaps the feeling that her glands may be a little more swollen in her neck, although denied any throat tightness, no shortness of breath concerns, no passing out episodes.     Patient Active Problem List   Diagnosis Date Noted  . Current moderate episode of major depressive disorder ([place]) 01/14/2020  . Daytime sleepiness 01/14/2020  .  Leukopenia 10/01/2018  . Sleep apnea 10/01/2018  . 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 Colon cancer screening   . Anxiety and depression 06/28/2018  . Dysphagia 06/28/2018  . Elevated LDL cholesterol level 06/28/2018  . Fatigue 06/28/2018  . Unspecified disturbances of smell and taste 06/28/2018  . History of anal fissures 06/28/2018  . Endometriosis of uterus 06/14/2017  . 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 Hypertension 04/05/2017  . 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 Abdominal hernia 04/01/2012  . LGSIL Pap smear of vagina 01/03/2012  . Anemia 04/07/2011  . 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 Lupus anticoagulant positive 11/07/2010      Current Outpatient Medications:  .  escitalopram (LEXAPRO) 10 MG tablet, Take 10 mg by mouth daily. As needed (Patient not taking: Reported on 06/02/2020), Disp: , Rfl:    Allergies  Allergen Reactions  . Biotin Hives and Palpitations  . Nylon Itching     Past Surgical History:  Procedure Laterality Date  . ANAL FISSURE REPAIR  2008  . COLONOSCOPY WITH PROPOFOL N/A 07/11/2018   Procedure: COLONOSCOPY WITH PROPOFOL;  Surgeon: Lin Landsman, MD;  Location: [facility] ENDOSCOPY;  Service: Gastroenterology;  Laterality: N/A;  . EXCISIONAL HEMORRHOIDECTOMY  7096   Bleeding complication  . HERNIA REPAIR       Family History  Problem Relation Age of Onset  . Stroke Mother        late  29s  . Hypertension Mother   . Lupus Cousin   . Breast cancer Cousin 60  . Cancer Father        Prostate  . Healthy Brother   . Hypertension Maternal Grandmother   . Heart attack Maternal Grandmother        before age 59  . Cancer Maternal Grandfather        Throat  . Hypertension Paternal Grandmother   . Cancer Paternal Grandmother        Jaw  . Stroke Paternal Grandfather        before age 62  . Hypertension Brother   . Healthy Son   . Breast cancer Cousin 27     Social History   Tobacco Use  .  Smoking status: Never Smoker  . Smokeless tobacco: Never Used  Substance Use Topics  . Alcohol use: No    With staff assistance, above reviewed with the patient today.  ROS: As per HPI, otherwise no specific complaints on a limited and focused system review   No results found for this or any previous visit (from the past 72 hour(s)).   PHQ2/9: Depression screen [facility] 2/9 06/02/2020 01/14/2020 09/05/2019 05/05/2019 02/27/2019  Decreased Interest 0 3 3 0 0  Down, Depressed, Hopeless 0 1 3 0 1  PHQ - 2 Score 0 4 6 0 1  Altered sleeping - 3 3 1 1   Tired, decreased energy - 3 2 0 1  Change in appetite - 1 1 0 0  Feeling bad or failure about yourself  - 1 3 0 0  Trouble concentrating - 3 3 0 1  Moving slowly or fidgety/restless - 0 1 0 0  Suicidal thoughts - 0 1 0 0  PHQ-9 Score - 15 20 1 4   Difficult doing work/chores - Very difficult Very difficult Not difficult at all Somewhat difficult  Some recent data might be hidden   PHQ-2/9 Result is neg  Fall Risk: Fall Risk  06/02/2020 01/14/2020 09/05/2019 05/05/2019 02/21/2019  Falls in the past year? 0 1 0 0 0  Number falls in past yr: 0 0 0 0 0  Injury with Fall? 0 0 0 0 0  Follow up Falls evaluation completed - Falls evaluation completed Falls evaluation completed -      Objective:   Vitals:   06/02/20 1039  BP: (!) 132/92  Pulse: 79  Resp: 16  Temp: 98 F (36.7 C)  TempSrc: Oral  SpO2: 99%  Weight: 181 lb 9.6 oz (82.4 kg)  Height: 5\' 3"  (1.6 m)    Body mass index is 32.17 kg/m.  Physical Exam   NAD, masked, pleasant  HEENT - [place]/AT, sclera anicteric, PERRL, EOMI, conj - non-inj'ed, no focal sinus tenderness, pharynx clear Neck - supple, no adenopathy, no rigidity Car - RRR without m/g/r, not tachycardic Pulm- RR and effort normal at rest, CTA without wheeze or rales Skin-a 4-1/2 x 4-1/2 cm slightly raised, erythematous welt-like entity was present at the site of the injection in the left deltoid.  Was mildly tender over the  erythematous area and slightly surrounding, no induration present, Ext -had good motion of her left shoulder, no involvement of the elbow wrist or hand joints, good grip strength, sensation intact to light touch in the upper extremity distally, distal pulses good Neuro/psychiatric - affect was not flat, appropriate with conversation  Alert with normal speech  Results for orders placed or performed in visit on 05/05/19  CBC with Differential/Platelet  Result Value Ref Range   WBC 4.8 3.8 - 10.8 Thousand/uL   RBC 4.53 3.80 - 5.10 Million/uL   Hemoglobin 11.4 (L) 11.7 - 15.5 g/dL   HCT 36.1 35 - 45 %   MCV 79.7 (L) 80.0 - 100.0 fL   MCH 25.2 (L) 27.0 - 33.0 pg   MCHC 31.6 (L) 32.0 - 36.0 g/dL   RDW 13.2 11.0 - 15.0 %   Platelets 335 140 - 400 Thousand/uL   MPV 10.1 7.5 - 12.5 fL   Neutro Abs 2,928 1,500 - 7,800 cells/uL   Lymphs Abs 1,416 850 - 3,900 cells/uL   Absolute Monocytes 408 200 - 950 cells/uL   Eosinophils Absolute 29 15 - 500 cells/uL   Basophils Absolute 19 0 - 200 cells/uL   Neutrophils Relative % 61 %   Total Lymphocyte 29.5 %   Monocytes Relative 8.5 %   Eosinophils Relative 0.6 %   Basophils Relative 0.4 %  Lipid panel  Result Value Ref Range   Cholesterol 162 <200 mg/dL   HDL 54 > OR = 50 mg/dL   Triglycerides 52 <150 mg/dL   LDL Cholesterol (Calc) 95 mg/dL (calc)   Total CHOL/HDL Ratio 3.0 <5.0 (calc)   Non-HDL Cholesterol (Calc) 108 <130 mg/dL (calc)  BASIC METABOLIC PANEL WITH GFR  Result Value Ref Range   Glucose, Bld 85 65 - 99 mg/dL   BUN 9 7 - 25 mg/dL   Creat 0.74 0.50 - 1.10 mg/dL   GFR, Est Non African American 96 > OR = 60 mL/min/1.31m2   GFR, Est African American 111 > OR = 60 mL/min/1.3m2   BUN/Creatinine Ratio NOT APPLICABLE 6 - 22 (calc)   Sodium 142 135 - 146 mmol/L   Potassium 4.1 3.5 - 5.3 mmol/L   Chloride 105 98 - 110 mmol/L   CO2 28 20 - 32 mmol/L   Calcium 9.8 8.6 - 10.2 mg/dL       Assessment & Plan:    1. Adverse effect of  vaccine, initial encounter  As we discussed, I do think this was a local reaction to the vaccination, and she notes it was bigger previously, and has improved some. Noted that while it is not a frank contraindication to getting the second vaccine,  I do understand the hesitancy in proceeding with that, and if it is desired not to get that second vaccination for Covid, I do think that is reasonable.  Noted always weigh the risk of a reaction to the vaccine versus protection from that vaccine.  Would use cool compresses to the area topically  Also a Benadryl product-25 mg every 6 hours during the day, although may make drowsy and be careful taking during the day, and can take 50 mg at bedtime to help.  Do expect this to continue to slowly improve, and noted that to her today.  If she does develop more concerning symptoms in association or if not improving or more problematic, should again follow-up.    Towanda Malkin, MD 06/02/20 11:06 AM

## 2020-06-08 DIAGNOSIS — E282 Polycystic ovarian syndrome: Secondary | ICD-10-CM | POA: Diagnosis not present

## 2020-06-08 DIAGNOSIS — E2839 Other primary ovarian failure: Secondary | ICD-10-CM | POA: Diagnosis not present

## 2020-06-08 DIAGNOSIS — B589 Toxoplasmosis, unspecified: Secondary | ICD-10-CM | POA: Diagnosis not present

## 2020-06-08 DIAGNOSIS — D899 Disorder involving the immune mechanism, unspecified: Secondary | ICD-10-CM | POA: Diagnosis not present

## 2020-06-08 DIAGNOSIS — E539 Vitamin B deficiency, unspecified: Secondary | ICD-10-CM | POA: Diagnosis not present

## 2020-06-08 DIAGNOSIS — B2710 Cytomegaloviral mononucleosis without complications: Secondary | ICD-10-CM | POA: Diagnosis not present

## 2020-06-08 DIAGNOSIS — Z131 Encounter for screening for diabetes mellitus: Secondary | ICD-10-CM | POA: Diagnosis not present

## 2020-06-08 DIAGNOSIS — E59 Dietary selenium deficiency: Secondary | ICD-10-CM | POA: Diagnosis not present

## 2020-06-08 DIAGNOSIS — E237 Disorder of pituitary gland, unspecified: Secondary | ICD-10-CM | POA: Diagnosis not present

## 2020-06-08 DIAGNOSIS — E279 Disorder of adrenal gland, unspecified: Secondary | ICD-10-CM | POA: Diagnosis not present

## 2020-06-08 DIAGNOSIS — R945 Abnormal results of liver function studies: Secondary | ICD-10-CM | POA: Diagnosis not present

## 2020-06-08 DIAGNOSIS — E038 Other specified hypothyroidism: Secondary | ICD-10-CM | POA: Diagnosis not present

## 2020-06-08 DIAGNOSIS — E559 Vitamin D deficiency, unspecified: Secondary | ICD-10-CM | POA: Diagnosis not present

## 2020-06-08 DIAGNOSIS — E729 Disorder of amino-acid metabolism, unspecified: Secondary | ICD-10-CM | POA: Diagnosis not present

## 2020-06-08 DIAGNOSIS — B2790 Infectious mononucleosis, unspecified without complication: Secondary | ICD-10-CM | POA: Diagnosis not present

## 2020-06-23 DIAGNOSIS — F411 Generalized anxiety disorder: Secondary | ICD-10-CM | POA: Diagnosis not present

## 2020-06-23 DIAGNOSIS — F4310 Post-traumatic stress disorder, unspecified: Secondary | ICD-10-CM | POA: Diagnosis not present

## 2020-06-23 DIAGNOSIS — F331 Major depressive disorder, recurrent, moderate: Secondary | ICD-10-CM | POA: Diagnosis not present

## 2020-06-29 DIAGNOSIS — F411 Generalized anxiety disorder: Secondary | ICD-10-CM | POA: Diagnosis not present

## 2020-06-29 DIAGNOSIS — F4310 Post-traumatic stress disorder, unspecified: Secondary | ICD-10-CM | POA: Diagnosis not present

## 2020-06-29 DIAGNOSIS — F331 Major depressive disorder, recurrent, moderate: Secondary | ICD-10-CM | POA: Diagnosis not present

## 2020-07-23 DIAGNOSIS — G4733 Obstructive sleep apnea (adult) (pediatric): Secondary | ICD-10-CM | POA: Diagnosis not present

## 2020-08-04 DIAGNOSIS — F331 Major depressive disorder, recurrent, moderate: Secondary | ICD-10-CM | POA: Diagnosis not present

## 2020-08-04 DIAGNOSIS — F4310 Post-traumatic stress disorder, unspecified: Secondary | ICD-10-CM | POA: Diagnosis not present

## 2020-08-04 DIAGNOSIS — F411 Generalized anxiety disorder: Secondary | ICD-10-CM | POA: Diagnosis not present

## 2020-08-06 ENCOUNTER — Telehealth: Payer: Self-pay

## 2020-08-06 NOTE — Telephone Encounter (Signed)
Copied from [place] [phone] [id number]. Topic: General - Other >> Aug 06, 2020 12:41 PM Desiree [name] D wrote: Pt was seeing on 06/02/20 for an allergic reaction to the covid vaccine, she's requesting a note for the Dr saying she's allergic to it, for her employee / please advise

## 2020-08-07 NOTE — Telephone Encounter (Signed)
Please advise 

## 2020-08-09 ENCOUNTER — Encounter: Payer: Self-pay | Admitting: Internal Medicine

## 2020-08-09 NOTE — Telephone Encounter (Signed)
Letter was sent to patient's MyChart. Patient called. Patient aware.

## 2020-08-09 NOTE — Telephone Encounter (Signed)
This was my assessment looking at her note from that visit: As we discussed, I do think this was a local reaction to the vaccination, and she notes it was bigger previously, and has improved some. Noted that while it is not a frank contraindication to getting the second vaccine,  I do understand the hesitancy in proceeding with that, and if it is desired not to get that second vaccination for Covid, I do think that is reasonable.  Noted always weigh the risk of a reaction to the vaccine versus protection from that vaccine.  The note can say she had a local reaction to the prior Covid vaccine administered.  Thanks.

## 2020-08-11 DIAGNOSIS — F411 Generalized anxiety disorder: Secondary | ICD-10-CM | POA: Diagnosis not present

## 2020-08-11 DIAGNOSIS — F4310 Post-traumatic stress disorder, unspecified: Secondary | ICD-10-CM | POA: Diagnosis not present

## 2020-08-11 DIAGNOSIS — F331 Major depressive disorder, recurrent, moderate: Secondary | ICD-10-CM | POA: Diagnosis not present

## 2020-08-25 DIAGNOSIS — G4733 Obstructive sleep apnea (adult) (pediatric): Secondary | ICD-10-CM | POA: Diagnosis not present

## 2020-08-31 ENCOUNTER — Encounter: Payer: Self-pay | Admitting: Internal Medicine

## 2020-09-01 DIAGNOSIS — F411 Generalized anxiety disorder: Secondary | ICD-10-CM | POA: Diagnosis not present

## 2020-09-01 DIAGNOSIS — F4310 Post-traumatic stress disorder, unspecified: Secondary | ICD-10-CM | POA: Diagnosis not present

## 2020-09-01 DIAGNOSIS — F331 Major depressive disorder, recurrent, moderate: Secondary | ICD-10-CM | POA: Diagnosis not present

## 2020-09-06 DIAGNOSIS — F411 Generalized anxiety disorder: Secondary | ICD-10-CM | POA: Diagnosis not present

## 2020-09-06 DIAGNOSIS — F331 Major depressive disorder, recurrent, moderate: Secondary | ICD-10-CM | POA: Diagnosis not present

## 2020-09-06 DIAGNOSIS — F4310 Post-traumatic stress disorder, unspecified: Secondary | ICD-10-CM | POA: Diagnosis not present

## 2020-09-22 DIAGNOSIS — F411 Generalized anxiety disorder: Secondary | ICD-10-CM | POA: Diagnosis not present

## 2020-09-22 DIAGNOSIS — F331 Major depressive disorder, recurrent, moderate: Secondary | ICD-10-CM | POA: Diagnosis not present

## 2020-09-22 DIAGNOSIS — F4310 Post-traumatic stress disorder, unspecified: Secondary | ICD-10-CM | POA: Diagnosis not present

## 2020-09-27 ENCOUNTER — Other Ambulatory Visit: Payer: Medicaid Other

## 2020-09-28 DIAGNOSIS — F411 Generalized anxiety disorder: Secondary | ICD-10-CM | POA: Diagnosis not present

## 2020-09-28 DIAGNOSIS — F4310 Post-traumatic stress disorder, unspecified: Secondary | ICD-10-CM | POA: Diagnosis not present

## 2020-09-28 DIAGNOSIS — F331 Major depressive disorder, recurrent, moderate: Secondary | ICD-10-CM | POA: Diagnosis not present

## 2020-10-06 DIAGNOSIS — F4310 Post-traumatic stress disorder, unspecified: Secondary | ICD-10-CM | POA: Diagnosis not present

## 2020-10-06 DIAGNOSIS — F411 Generalized anxiety disorder: Secondary | ICD-10-CM | POA: Diagnosis not present

## 2020-10-06 DIAGNOSIS — F331 Major depressive disorder, recurrent, moderate: Secondary | ICD-10-CM | POA: Diagnosis not present

## 2020-10-13 DIAGNOSIS — F411 Generalized anxiety disorder: Secondary | ICD-10-CM | POA: Diagnosis not present

## 2020-10-13 DIAGNOSIS — F331 Major depressive disorder, recurrent, moderate: Secondary | ICD-10-CM | POA: Diagnosis not present

## 2020-10-13 DIAGNOSIS — F4310 Post-traumatic stress disorder, unspecified: Secondary | ICD-10-CM | POA: Diagnosis not present

## 2020-10-19 DIAGNOSIS — F411 Generalized anxiety disorder: Secondary | ICD-10-CM | POA: Diagnosis not present

## 2020-10-19 DIAGNOSIS — F4310 Post-traumatic stress disorder, unspecified: Secondary | ICD-10-CM | POA: Diagnosis not present

## 2020-10-19 DIAGNOSIS — F331 Major depressive disorder, recurrent, moderate: Secondary | ICD-10-CM | POA: Diagnosis not present

## 2020-10-27 DIAGNOSIS — F331 Major depressive disorder, recurrent, moderate: Secondary | ICD-10-CM | POA: Diagnosis not present

## 2020-10-27 DIAGNOSIS — G4733 Obstructive sleep apnea (adult) (pediatric): Secondary | ICD-10-CM | POA: Diagnosis not present

## 2020-10-27 DIAGNOSIS — F4310 Post-traumatic stress disorder, unspecified: Secondary | ICD-10-CM | POA: Diagnosis not present

## 2020-10-27 DIAGNOSIS — F411 Generalized anxiety disorder: Secondary | ICD-10-CM | POA: Diagnosis not present

## 2020-11-10 DIAGNOSIS — F4310 Post-traumatic stress disorder, unspecified: Secondary | ICD-10-CM | POA: Diagnosis not present

## 2020-11-10 DIAGNOSIS — F411 Generalized anxiety disorder: Secondary | ICD-10-CM | POA: Diagnosis not present

## 2020-11-10 DIAGNOSIS — F331 Major depressive disorder, recurrent, moderate: Secondary | ICD-10-CM | POA: Diagnosis not present

## 2020-11-21 DIAGNOSIS — F4310 Post-traumatic stress disorder, unspecified: Secondary | ICD-10-CM | POA: Diagnosis not present

## 2020-11-21 DIAGNOSIS — F411 Generalized anxiety disorder: Secondary | ICD-10-CM | POA: Diagnosis not present

## 2020-11-21 DIAGNOSIS — F331 Major depressive disorder, recurrent, moderate: Secondary | ICD-10-CM | POA: Diagnosis not present

## 2020-11-23 DIAGNOSIS — G4733 Obstructive sleep apnea (adult) (pediatric): Secondary | ICD-10-CM | POA: Diagnosis not present

## 2020-12-08 DIAGNOSIS — F411 Generalized anxiety disorder: Secondary | ICD-10-CM | POA: Diagnosis not present

## 2020-12-08 DIAGNOSIS — F331 Major depressive disorder, recurrent, moderate: Secondary | ICD-10-CM | POA: Diagnosis not present

## 2020-12-08 DIAGNOSIS — F4310 Post-traumatic stress disorder, unspecified: Secondary | ICD-10-CM | POA: Diagnosis not present

## 2020-12-15 DIAGNOSIS — F4310 Post-traumatic stress disorder, unspecified: Secondary | ICD-10-CM | POA: Diagnosis not present

## 2020-12-15 DIAGNOSIS — F331 Major depressive disorder, recurrent, moderate: Secondary | ICD-10-CM | POA: Diagnosis not present

## 2020-12-15 DIAGNOSIS — F411 Generalized anxiety disorder: Secondary | ICD-10-CM | POA: Diagnosis not present

## 2020-12-22 DIAGNOSIS — F331 Major depressive disorder, recurrent, moderate: Secondary | ICD-10-CM | POA: Diagnosis not present

## 2020-12-22 DIAGNOSIS — F4310 Post-traumatic stress disorder, unspecified: Secondary | ICD-10-CM | POA: Diagnosis not present

## 2020-12-22 DIAGNOSIS — F411 Generalized anxiety disorder: Secondary | ICD-10-CM | POA: Diagnosis not present

## 2020-12-23 DIAGNOSIS — F4310 Post-traumatic stress disorder, unspecified: Secondary | ICD-10-CM | POA: Diagnosis not present

## 2020-12-23 DIAGNOSIS — F411 Generalized anxiety disorder: Secondary | ICD-10-CM | POA: Diagnosis not present

## 2020-12-23 DIAGNOSIS — F331 Major depressive disorder, recurrent, moderate: Secondary | ICD-10-CM | POA: Diagnosis not present

## 2020-12-24 DIAGNOSIS — G4733 Obstructive sleep apnea (adult) (pediatric): Secondary | ICD-10-CM | POA: Diagnosis not present

## 2020-12-30 ENCOUNTER — Other Ambulatory Visit: Payer: Self-pay

## 2020-12-30 ENCOUNTER — Encounter: Payer: Self-pay | Admitting: Family Medicine

## 2020-12-30 ENCOUNTER — Ambulatory Visit: Payer: Medicaid Other | Admitting: Family Medicine

## 2020-12-30 VITALS — BP 124/82 | HR 81 | Temp 98.2°F | Resp 14 | Ht 63.0 in | Wt 179.7 lb

## 2020-12-30 DIAGNOSIS — R1030 Lower abdominal pain, unspecified: Secondary | ICD-10-CM | POA: Diagnosis not present

## 2020-12-30 DIAGNOSIS — D649 Anemia, unspecified: Secondary | ICD-10-CM | POA: Diagnosis not present

## 2020-12-30 DIAGNOSIS — Z1231 Encounter for screening mammogram for malignant neoplasm of breast: Secondary | ICD-10-CM | POA: Diagnosis not present

## 2020-12-30 DIAGNOSIS — K5790 Diverticulosis of intestine, part unspecified, without perforation or abscess without bleeding: Secondary | ICD-10-CM | POA: Diagnosis not present

## 2020-12-30 MED ORDER — METRONIDAZOLE 500 MG PO TABS: 500.0000 mg | ORAL_TABLET | Freq: Three times a day (TID) | ORAL | 0 refills | Status: AC

## 2020-12-30 MED ORDER — CIPROFLOXACIN HCL 500 MG PO TABS: 500.0000 mg | ORAL_TABLET | Freq: Two times a day (BID) | ORAL | 0 refills | Status: AC

## 2020-12-30 MED ORDER — ONDANSETRON 4 MG PO TBDP: 4.0000 mg | ORAL_TABLET | Freq: Three times a day (TID) | ORAL | 0 refills | Status: DC | PRN

## 2020-12-30 MED ORDER — DICYCLOMINE HCL 20 MG PO TABS: 20.0000 mg | ORAL_TABLET | Freq: Three times a day (TID) | ORAL | 1 refills | Status: DC | PRN

## 2020-12-30 NOTE — Progress Notes (Signed)
[name], female    DOB: [birth year], 50 y.o.   MRN: [id number]  PCP: Towanda Malkin, MD (Inactive)  Chief Complaint  Patient presents with  . Abdominal Pain    Odorless gas, concerns she could have colon cancer.  Would like referral to GI.  Has had several colonoscopies in past due to polyps.    Subjective:   Desiree [name] is a 50 y.o. female, presents to clinic with CC of the following:  Pt presents for generalized low abd pain currently 5/10, more severe some days over the past 2-3 weeks.  No abdomen is difficult to describe gets worse for all days at a time usually improves after a bowel movement which is softer than her normal.  Her normal bowel pattern since having a formed bowel movement Bristol scale 3 about 1-2 times a day.  She reports some flatulence that is not malodorous.  She denies any radiation of pain, belching, vomiting.  She feels nauseous but otherwise denies any associated symptoms.  She denies any sick contacts.  She has a history of hemorrhoidectomy and anal fissure, umbilical hernia repair and 2 prior pregnancies but no other abdominal surgeries.  She denies any associated urinary complaints or changes to her menstrual cycles.  She previously saw [facility] and had a screening colonoscopy.  There was some abnormalities noted on the colonoscopy and extensive diverticular disease, patient was not symptomatic at the time of colonoscopy and was encouraged to follow-up for routine colon cancer screening in 10 years.  She would like to see GI however would like to be referred to a different clinic.  She does not believe she sees any blood in her stool or with wiping after bowel movements.  She denies any fever chills sweats change in appetite or weight, rash, sick contacts.  Abdominal Pain This is a new problem. Episode onset: 2-3 weeks. The onset quality is undetermined. The problem occurs intermittently. The problem has been unchanged. Pain  location: across low abdomen. The pain is at a severity of 5/10. The quality of the pain is aching (sore). The abdominal pain does not radiate. Associated symptoms include flatus and nausea. Pertinent negatives include no anorexia, arthralgias, belching, constipation, diarrhea, dysuria, fever, frequency, headaches, hematochezia, hematuria, melena, myalgias, vomiting or weight loss. Nothing aggravates the pain. The pain is relieved by bowel movements. She has tried nothing for the symptoms. Prior workup: past colonoscopy. There is no history of abdominal surgery, colon cancer, Crohn's disease, gallstones, GERD, irritable bowel syndrome, pancreatitis, PUD or ulcerative colitis. diverticular disease on screening CT    abd pain x 2-3 weeks  Patient Active Problem List   Diagnosis Date Noted  . Immunization reaction 06/02/2020  . Current moderate episode of major depressive disorder ([place]) 01/14/2020  . Daytime sleepiness 01/14/2020  . Leukopenia 10/01/2018  . Sleep apnea 10/01/2018  . 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 Colon cancer screening   . Anxiety and depression 06/28/2018  . Dysphagia 06/28/2018  . Elevated LDL cholesterol level 06/28/2018  . Fatigue 06/28/2018  . Unspecified disturbances of smell and taste 06/28/2018  . History of anal fissures 06/28/2018  . Endometriosis of uterus 06/14/2017  . 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 Hypertension 04/05/2017  . 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 Abdominal hernia 04/01/2012  . LGSIL Pap smear of vagina 01/03/2012  . Anemia 04/07/2011  . 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 Lupus anticoagulant positive 11/07/2010  No current outpatient medications on file.   Allergies  Allergen Reactions  . Biotin Hives and Palpitations  . Nylon Itching  . Cheese Other (See Comments) and Rash     Social History   Tobacco Use  . Smoking status: Never Smoker  . Smokeless tobacco: Never Used  Vaping Use   . Vaping Use: Never used  Substance Use Topics  . Alcohol use: No  . Drug use: No      Chart Review Today: I personally reviewed active problem list, medication list, allergies, family history, social history, health maintenance, notes from last encounter, lab results, imaging with the patient/caregiver today. Extensive chart review including past colonoscopy and pathology report  Review of Systems  Constitutional: Negative.  Negative for fever and weight loss.  HENT: Negative.   Eyes: Negative.   Respiratory: Negative.   Cardiovascular: Negative.   Gastrointestinal: Positive for abdominal pain, flatus and nausea. Negative for anorexia, constipation, diarrhea, hematochezia, melena and vomiting.  Endocrine: Negative.   Genitourinary: Negative.  Negative for decreased urine volume, difficulty urinating, dyspareunia, dysuria, enuresis, flank pain, frequency, genital sores, hematuria, menstrual problem, pelvic pain, urgency, vaginal bleeding, vaginal discharge and vaginal pain.  Musculoskeletal: Negative.  Negative for arthralgias, back pain and myalgias.  Skin: Negative.  Negative for color change, pallor and rash.  Allergic/Immunologic: Negative.   Neurological: Negative.  Negative for headaches.  Hematological: Negative.   Psychiatric/Behavioral: Negative.   All other systems reviewed and are negative.      Objective:   Vitals:   12/30/20 1542  BP: 124/82  Pulse: 81  Resp: 14  Temp: 98.2 F (36.8 C)  SpO2: 96%  Weight: 179 lb 11.2 oz (81.5 kg)  Height: 5\' 3"  (1.6 m)    Body mass index is 31.83 kg/m.  Physical Exam Vitals and nursing note reviewed.  Constitutional:      General: She is not in acute distress.    Appearance: Normal appearance. She is well-developed. She is obese. She is not ill-appearing, toxic-appearing or diaphoretic.     Interventions: Face mask in place.  HENT:     Head: Normocephalic and atraumatic.     Right Ear: External ear normal.      Left Ear: External ear normal.     Mouth/Throat:     Mouth: Mucous membranes are moist.     Pharynx: Oropharynx is clear.  Eyes:     General: Lids are normal. No scleral icterus.       Right eye: No discharge.        Left eye: No discharge.     Conjunctiva/sclera: Conjunctivae normal.  Neck:     Trachea: Phonation normal. No tracheal deviation.  Cardiovascular:     Rate and Rhythm: Normal rate and regular rhythm.     Pulses: Normal pulses.          Radial pulses are 2+ on the right side and 2+ on the left side.       Posterior tibial pulses are 2+ on the right side and 2+ on the left side.     Heart sounds: Normal heart sounds. No murmur heard. No friction rub. No gallop.   Pulmonary:     Effort: Pulmonary effort is normal. No respiratory distress.     Breath sounds: Normal breath sounds. No stridor. No wheezing, rhonchi or rales.  Chest:     Chest wall: No tenderness.  Abdominal:     General: Bowel sounds are normal. There is no distension or abdominal  bruit.     Palpations: Abdomen is soft. There is no fluid wave, hepatomegaly, splenomegaly, mass or pulsatile mass.     Tenderness: There is abdominal tenderness in the right lower quadrant, suprapubic area and left lower quadrant. There is no right CVA tenderness, left CVA tenderness, guarding or rebound. Negative signs include Murphy's sign, Rovsing's sign and McBurney's sign.  Musculoskeletal:     Right lower leg: No edema.     Left lower leg: No edema.  Skin:    General: Skin is warm and dry.     Coloration: Skin is not jaundiced or pale.     Findings: No rash.  Neurological:     Mental Status: She is alert.     Motor: No abnormal muscle tone.     Gait: Gait normal.  Psychiatric:        Mood and Affect: Mood normal.        Speech: Speech normal.        Behavior: Behavior normal.      Results for orders placed or performed in visit on 05/05/19  CBC with Differential/Platelet  Result Value Ref Range   WBC 4.8 3.8 -  10.8 Thousand/uL   RBC 4.53 3.80 - 5.10 Million/uL   Hemoglobin 11.4 (L) 11.7 - 15.5 g/dL   HCT 36.1 35.0 - 45.0 %   MCV 79.7 (L) 80.0 - 100.0 fL   MCH 25.2 (L) 27.0 - 33.0 pg   MCHC 31.6 (L) 32.0 - 36.0 g/dL   RDW 13.2 11.0 - 15.0 %   Platelets 335 140 - 400 Thousand/uL   MPV 10.1 7.5 - 12.5 fL   Neutro Abs 2,928 1,500 - 7,800 cells/uL   Lymphs Abs 1,416 850 - 3,900 cells/uL   Absolute Monocytes 408 200 - 950 cells/uL   Eosinophils Absolute 29 15 - 500 cells/uL   Basophils Absolute 19 0 - 200 cells/uL   Neutrophils Relative % 61 %   Total Lymphocyte 29.5 %   Monocytes Relative 8.5 %   Eosinophils Relative 0.6 %   Basophils Relative 0.4 %  Lipid panel  Result Value Ref Range   Cholesterol 162 <200 mg/dL   HDL 54 > OR = 50 mg/dL   Triglycerides 52 <150 mg/dL   LDL Cholesterol (Calc) 95 mg/dL (calc)   Total CHOL/HDL Ratio 3.0 <5.0 (calc)   Non-HDL Cholesterol (Calc) 108 <130 mg/dL (calc)  BASIC METABOLIC PANEL WITH GFR  Result Value Ref Range   Glucose, Bld 85 65 - 99 mg/dL   BUN 9 7 - 25 mg/dL   Creat 0.74 0.50 - 1.10 mg/dL   GFR, Est Non African American 96 > OR = 60 mL/min/1.24m2   GFR, Est African American 111 > OR = 60 mL/min/1.48m2   BUN/Creatinine Ratio NOT APPLICABLE 6 - 22 (calc)   Sodium 142 135 - 146 mmol/L   Potassium 4.1 3.5 - 5.3 mmol/L   Chloride 105 98 - 110 mmol/L   CO2 28 20 - 32 mmol/L   Calcium 9.8 8.6 - 10.2 mg/dL       Assessment & Plan:     ICD-10-CM   1. Lower abdominal pain  R10.30 Ambulatory referral to Gastroenterology    CBC with Differential/Platelet    COMPLETE METABOLIC PANEL WITH GFR    Urinalysis, Routine w reflex microscopic    Urine Culture    ciprofloxacin (CIPRO) 500 MG tablet    metroNIDAZOLE (FLAGYL) 500 MG tablet   suspect diverticulitis, starting abx, any worsening pt  encouraged to f/up  2. Anemia, unspecified type  D64.9 CBC with Differential/Platelet   chronic mild anemia, appears fairly stable, pt reports no known cause  or past work up, always told it was cause of menses  3. Diverticular disease  K57.90 Ambulatory referral to Gastroenterology   noted as severe on prior colonoscopy - f/up with GI   4. Encounter for screening mammogram for malignant neoplasm of breast  Z12.31 MM 3D SCREEN BREAST BILATERAL    Patient denies any urinary or GU symptoms, urine sample obtained to rule out any possible UTI.  Stool cards x3 to rule out any GI blood loss, labs have not been done for over a year will recheck blood work.   Patient is well-appearing, VSS today, some mild tenderness on exam mostly the suprapubic and left lower quadrant area without involuntary guarding or rebound tenderness, she is having mild nausea but able to eat drink having normal urination and bowel movements. Will start tx for suspected diverticulitis - any worsening or delay to improve in 3-5 d will try to get CT zofran for +N Can try bentyl to see if it helps with low abd pain  We reviewed return precautions and ER precautions today. More than 25 min spent with pt in exam room today More than 5+ min spent on chart review More than 10+ min spent on charting/documentation     Delsa Grana, PA-C 12/30/20 4:03 PM

## 2020-12-31 LAB — URINE CULTURE
MICRO NUMBER:: 11744470
Result:: NO GROWTH
SPECIMEN QUALITY:: ADEQUATE

## 2020-12-31 LAB — URINALYSIS, ROUTINE W REFLEX MICROSCOPIC
Bacteria, UA: NONE SEEN /HPF
Bilirubin Urine: NEGATIVE
Glucose, UA: NEGATIVE
Hgb urine dipstick: NEGATIVE
Hyaline Cast: NONE SEEN /LPF
Ketones, ur: NEGATIVE
Leukocytes,Ua: NEGATIVE
Nitrite: NEGATIVE
Specific Gravity, Urine: 1.019 (ref 1.001–1.03)
WBC, UA: NONE SEEN /HPF (ref 0–5)
pH: 8.5 — ABNORMAL HIGH (ref 5.0–8.0)

## 2020-12-31 LAB — CBC WITH DIFFERENTIAL/PLATELET
Absolute Monocytes: 414 cells/uL (ref 200–950)
Basophils Absolute: 18 cells/uL (ref 0–200)
Basophils Relative: 0.4 %
Eosinophils Absolute: 81 cells/uL (ref 15–500)
Eosinophils Relative: 1.8 %
HCT: 35.7 % (ref 35.0–45.0)
Hemoglobin: 11.2 g/dL — ABNORMAL LOW (ref 11.7–15.5)
Lymphs Abs: 1931 cells/uL (ref 850–3900)
MCH: 25.2 pg — ABNORMAL LOW (ref 27.0–33.0)
MCHC: 31.4 g/dL — ABNORMAL LOW (ref 32.0–36.0)
MCV: 80.2 fL (ref 80.0–100.0)
MPV: 10.3 fL (ref 7.5–12.5)
Monocytes Relative: 9.2 %
Neutro Abs: 2057 cells/uL (ref 1500–7800)
Neutrophils Relative %: 45.7 %
Platelets: 350 10*3/uL (ref 140–400)
RBC: 4.45 10*6/uL (ref 3.80–5.10)
RDW: 13.1 % (ref 11.0–15.0)
Total Lymphocyte: 42.9 %
WBC: 4.5 10*3/uL (ref 3.8–10.8)

## 2020-12-31 LAB — COMPLETE METABOLIC PANEL WITH GFR
AG Ratio: 1.5 (calc) (ref 1.0–2.5)
ALT: 27 U/L (ref 6–29)
AST: 26 U/L (ref 10–35)
Albumin: 4.3 g/dL (ref 3.6–5.1)
Alkaline phosphatase (APISO): 65 U/L (ref 37–153)
BUN: 9 mg/dL (ref 7–25)
CO2: 29 mmol/L (ref 20–32)
Calcium: 9.9 mg/dL (ref 8.6–10.4)
Chloride: 106 mmol/L (ref 98–110)
Creat: 0.79 mg/dL (ref 0.50–1.05)
GFR, Est African American: 101 mL/min/{1.73_m2} (ref >=60)
GFR, Est Non African American: 87 mL/min/{1.73_m2} (ref >=60)
Globulin: 2.9 g/dL (calc) (ref 1.9–3.7)
Glucose, Bld: 77 mg/dL (ref 65–99)
Potassium: 4.2 mmol/L (ref 3.5–5.3)
Sodium: 141 mmol/L (ref 135–146)
Total Bilirubin: 0.4 mg/dL (ref 0.2–1.2)
Total Protein: 7.2 g/dL (ref 6.1–8.1)

## 2020-12-31 LAB — MICROSCOPIC MESSAGE

## 2021-01-10 ENCOUNTER — Ambulatory Visit: Payer: Medicaid Other | Admitting: Family Medicine

## 2021-01-10 ENCOUNTER — Other Ambulatory Visit: Payer: Self-pay

## 2021-01-10 ENCOUNTER — Encounter: Payer: Self-pay | Admitting: Family Medicine

## 2021-01-10 VITALS — BP 120/76 | HR 86 | Temp 98.3°F | Resp 16 | Ht 63.0 in | Wt 182.0 lb

## 2021-01-10 DIAGNOSIS — Z8349 Family history of other endocrine, nutritional and metabolic diseases: Secondary | ICD-10-CM | POA: Diagnosis not present

## 2021-01-10 DIAGNOSIS — D691 Qualitative platelet defects: Secondary | ICD-10-CM

## 2021-01-10 DIAGNOSIS — Z13220 Encounter for screening for lipoid disorders: Secondary | ICD-10-CM

## 2021-01-10 DIAGNOSIS — E559 Vitamin D deficiency, unspecified: Secondary | ICD-10-CM

## 2021-01-10 DIAGNOSIS — F325 Major depressive disorder, single episode, in full remission: Secondary | ICD-10-CM

## 2021-01-10 DIAGNOSIS — G4733 Obstructive sleep apnea (adult) (pediatric): Secondary | ICD-10-CM | POA: Diagnosis not present

## 2021-01-10 DIAGNOSIS — R7303 Prediabetes: Secondary | ICD-10-CM

## 2021-01-10 DIAGNOSIS — R400 Somnolence: Secondary | ICD-10-CM | POA: Diagnosis not present

## 2021-01-10 DIAGNOSIS — N63 Unspecified lump in unspecified breast: Secondary | ICD-10-CM

## 2021-01-10 DIAGNOSIS — Z1159 Encounter for screening for other viral diseases: Secondary | ICD-10-CM | POA: Diagnosis not present

## 2021-01-10 NOTE — Progress Notes (Signed)
Name: Desiree [name]   MRN: [id number]    DOB: [birth year]   Date:01/10/2021       Progress Note  Subjective  Chief Complaint  Breast pain  HPI  Anemia/prolonged PTT/platelet dysfunction  : seen by hematologist at [facility] and since levels stabilized she was released from there care. She denies pica , no recent episodes of palpitation or sob  MDD: she has a long history of depression, currently off medication, she is under the safe foundation and having therapy once a week. She states she has been doing well, she is taking an otc supplementation called cortisol support, and [employer] otc  OSA: she is under the care of pulmonologist , she goes once a year, she wears mask every night all night. Pressure is 13 cmH2O. She states as long as she is taking supplementation for depression symptoms of day time fatigue are controlled   Vitamin D deficiency: she is currently taking supplementation daily.   LLQ pain: she was seen last week given bentyl and zofran, doing well, no change in bowel movements, no fever or chills. Feeling well   Breast pain: she noticed left breast pain, sometimes shoots from under left axilla to her nipple, also one tender spot on inner upper quadrant. Pain started a couple of weeks ago, she states pain was more frequent last week, symptoms improved, only occasionally now. She is due for a mammogram. No nipple discharge, she has a small rash on left breast.   Pre-diabetes: discussed A1C was 6.1 %, she states checks glucose at home occasionally, she states it has been as high as 160's . She denies polyphagia, polydipsia or polyuria. She has intermittent blurred vision  Patient Active Problem List   Diagnosis Date Noted  . Immunization reaction 06/02/2020  . Current moderate episode of major depressive disorder ([place]) 01/14/2020  . Daytime sleepiness 01/14/2020  . Sleep apnea 10/01/2018  . 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 Dysphagia 06/28/2018  . Unspecified disturbances of smell  and taste 06/28/2018  . History of anal fissures 06/28/2018  . Endometriosis of uterus 06/14/2017  . 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 Abdominal hernia 04/01/2012  . Anemia 04/07/2011  . 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 Lupus anticoagulant positive 11/07/2010    Past Surgical History:  Procedure Laterality Date  . ANAL FISSURE REPAIR  2008  . COLONOSCOPY WITH PROPOFOL N/A 07/11/2018   Procedure: COLONOSCOPY WITH PROPOFOL;  Surgeon: Lin Landsman, MD;  Location: [facility] ENDOSCOPY;  Service: Gastroenterology;  Laterality: N/A;  . EXCISIONAL HEMORRHOIDECTOMY  9381   Bleeding complication  . HERNIA REPAIR      Family History  Problem Relation Age of Onset  . Stroke Mother        late 53s  . Hypertension Mother   . Lupus Cousin   . Breast cancer Cousin 59  . Cancer Father        Prostate  . Healthy Brother   . Hypertension Maternal Grandmother   . Heart attack Maternal Grandmother        before age 37  . Cancer Maternal Grandfather        Throat  . Hypertension Paternal Grandmother   . Cancer Paternal Grandmother        Jaw  . Stroke Paternal Grandfather        before age 61  . Hypertension Brother   . Healthy Son   . Breast cancer Cousin 40  Social History   Tobacco Use  . Smoking status: Never Smoker  . Smokeless tobacco: Never Used  Substance Use Topics  . Alcohol use: No     Current Outpatient Medications:  .  dicyclomine (BENTYL) 20 MG tablet, Take 1 tablet (20 mg total) by mouth 3 (three) times daily as needed for spasms (abd cramping)., Disp: 20 tablet, Rfl: 1 .  ondansetron (ZOFRAN ODT) 4 MG disintegrating tablet, Take 1 tablet (4 mg total) by mouth every 8 (eight) hours as needed for nausea or vomiting., Disp: 20 tablet, Rfl: 0  Allergies  Allergen Reactions  . Biotin Hives and Palpitations  . Nylon Itching  . Cheese Other (See Comments) and Rash    I  personally reviewed active problem list, medication list, allergies, family history, social history, health maintenance, notes from last encounter with the patient/caregiver today.   ROS  Constitutional: Negative for fever or weight change.  Respiratory: Negative for cough and shortness of breath.   Cardiovascular: Negative for chest pain or palpitations.  Gastrointestinal: Negative for abdominal pain, no bowel changes.  Musculoskeletal: Negative for gait problem or joint swelling.  Skin: positive  for rash.  Neurological: Negative for dizziness or headache.  No other specific complaints in a complete review of systems (except as listed in HPI above).  Objective  Vitals:   01/10/21 0904  BP: 120/76  Pulse: 86  Resp: 16  Temp: 98.3 F (36.8 C)  TempSrc: Oral  SpO2: 99%  Weight: 182 lb (82.6 kg)  Height: 5\' 3"  (1.6 m)    Body mass index is 32.24 kg/m.  Physical Exam  Constitutional: Patient appears well-developed and well-nourished. Obese  No distress.  HEENT: head atraumatic, normocephalic, pupils equal and reactive to light,  neck supple Cardiovascular: Normal rate, regular rhythm and normal heart sounds.  No murmur heard. No BLE edema. Pulmonary/Chest: Effort normal and breath sounds normal. No respiratory distress. Abdominal: Soft.  There is no tenderness. Breast: lumps / tender on both breasts at 11 o'clock  Psychiatric: Patient has a normal mood and affect. behavior is normal. Judgment and thought content normal.  Recent Results (from the past 2160 hour(s))  CBC with Differential/Platelet     Status: Abnormal   Collection Time: 12/30/20  4:18 PM  Result Value Ref Range   WBC 4.5 3.8 - 10.8 Thousand/uL   RBC 4.45 3.80 - 5.10 Million/uL   Hemoglobin 11.2 (L) 11.7 - 15.5 g/dL   HCT 35.7 35.0 - 45.0 %   MCV 80.2 80.0 - 100.0 fL   MCH 25.2 (L) 27.0 - 33.0 pg   MCHC 31.4 (L) 32.0 - 36.0 g/dL   RDW 13.1 11.0 - 15.0 %   Platelets 350 140 - 400 Thousand/uL   MPV 10.3  7.5 - 12.5 fL   Neutro Abs 2,057 1,500 - 7,800 cells/uL   Lymphs Abs 1,931 850 - 3,900 cells/uL   Absolute Monocytes 414 200 - 950 cells/uL   Eosinophils Absolute 81 15 - 500 cells/uL   Basophils Absolute 18 0 - 200 cells/uL   Neutrophils Relative % 45.7 %   Total Lymphocyte 42.9 %   Monocytes Relative 9.2 %   Eosinophils Relative 1.8 %   Basophils Relative 0.4 %  COMPLETE METABOLIC PANEL WITH GFR     Status: None   Collection Time: 12/30/20  4:18 PM  Result Value Ref Range   Glucose, Bld 77 65 - 99 mg/dL    Comment: .  Fasting reference interval .    BUN 9 7 - 25 mg/dL   Creat 0.79 0.50 - 1.05 mg/dL    Comment: For patients >12 years of age, the reference limit for Creatinine is approximately 13% higher for people identified as African-American. .    GFR, Est Non African American 87 > OR = 60 mL/min/1.89m2   GFR, Est African American 101 > OR = 60 mL/min/1.76m2   BUN/Creatinine Ratio NOT APPLICABLE 6 - 22 (calc)   Sodium 141 135 - 146 mmol/L   Potassium 4.2 3.5 - 5.3 mmol/L   Chloride 106 98 - 110 mmol/L   CO2 29 20 - 32 mmol/L   Calcium 9.9 8.6 - 10.4 mg/dL   Total Protein 7.2 6.1 - 8.1 g/dL   Albumin 4.3 3.6 - 5.1 g/dL   Globulin 2.9 1.9 - 3.7 g/dL (calc)   AG Ratio 1.5 1.0 - 2.5 (calc)   Total Bilirubin 0.4 0.2 - 1.2 mg/dL   Alkaline phosphatase (APISO) 65 37 - 153 U/L   AST 26 10 - 35 U/L   ALT 27 6 - 29 U/L  Urinalysis, Routine w reflex microscopic     Status: Abnormal   Collection Time: 12/30/20  4:18 PM  Result Value Ref Range   Color, Urine YELLOW YELLOW   APPearance CLEAR CLEAR   Specific Gravity, Urine 1.019 1.001 - 1.03   pH 8.5 (H) 5.0 - 8.0   Glucose, UA NEGATIVE NEGATIVE   Bilirubin Urine NEGATIVE NEGATIVE   Ketones, ur NEGATIVE NEGATIVE   Hgb urine dipstick NEGATIVE NEGATIVE   Protein, ur TRACE (A) NEGATIVE   Nitrite NEGATIVE NEGATIVE   Leukocytes,Ua NEGATIVE NEGATIVE   WBC, UA NONE SEEN 0 - 5 /HPF   RBC / HPF 0-2 0 - 2 /HPF    Squamous Epithelial / LPF 0-5 < OR = 5 /HPF   Bacteria, UA NONE SEEN NONE SEEN /HPF   Hyaline Cast NONE SEEN NONE SEEN /LPF  Urine Culture     Status: None   Collection Time: 12/30/20  4:18 PM   Specimen: Urine  Result Value Ref Range   MICRO NUMBER: [id number]    SPECIMEN QUALITY: Adequate    Sample Source URINE    STATUS: FINAL    Result: No Growth   MICROSCOPIC MESSAGE     Status: None   Collection Time: 12/30/20  4:18 PM  Result Value Ref Range   Note      Comment: This urine was analyzed for the presence of WBC,  RBC, bacteria, casts, and other formed elements.  Only those elements seen were reported. . .      PHQ2/9: Depression screen [facility] 2/9 01/10/2021 12/30/2020 06/02/2020 01/14/2020 09/05/2019  Decreased Interest 1 1 0 3 3  Down, Depressed, Hopeless 0 0 0 1 3  PHQ - 2 Score 1 1 0 4 6  Altered sleeping 3 0 - 3 3  Tired, decreased energy 3 0 - 3 2  Change in appetite 3 0 - 1 1  Feeling bad or failure about yourself  1 0 - 1 3  Trouble concentrating 0 0 - 3 3  Moving slowly or fidgety/restless 0 0 - 0 1  Suicidal thoughts 0 0 - 0 1  PHQ-9 Score 11 1 - 15 20  Difficult doing work/chores - Not difficult at all - Very difficult Very difficult  Some recent data might be hidden    phq 9 is positive   Fall Risk: Fall Risk  01/10/2021 12/30/2020 06/02/2020 01/14/2020 09/05/2019  Falls in the past year? 1 1 0 1 0  Number falls in past yr: 0 0 0 0 0  Injury with Fall? 0 0 0 0 0  Follow up - - Falls evaluation completed - Falls evaluation completed     Functional Status Survey: Is the patient deaf or have difficulty hearing?: No Does the patient have difficulty seeing, even when wearing glasses/contacts?: No Does the patient have difficulty concentrating, remembering, or making decisions?: Yes Does the patient have difficulty walking or climbing stairs?: No Does the patient have difficulty dressing or bathing?: No Does the patient have difficulty doing errands alone such as  visiting a doctor's office or shopping?: No    Assessment & Plan  1. Obstructive sleep apnea syndrome  Continue daily CPAP use  2. 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 Major depression in remission [facility])  Keep follow up with therapist   4. Daytime sleepiness  Doing better with supplementation   5. Pre-diabetes  - Hemoglobin A1c  6. Vitamin D deficiency  - VITAMIN D 25 Hydroxy (Vit-D Deficiency, Fractures)  7. Family history of hypothyroidism  - TSH  8. Breast lump in female  - MM Digital Diagnostic Bilat; Future - US BREAST LTD UNI LEFT INC AXILLA; Future - US BREAST LTD UNI RIGHT INC AXILLA; Future  9. Need for hepatitis C screening test  - Hepatitis C antibody  10. Lipid screening  - Lipid panel

## 2021-01-11 LAB — HEPATITIS C ANTIBODY
Hepatitis C Ab: NONREACTIVE
SIGNAL TO CUT-OFF: 0 (ref <1.00)

## 2021-01-11 LAB — LIPID PANEL
Cholesterol: 164 mg/dL (ref <200)
HDL: 50 mg/dL (ref >=50)
LDL Cholesterol (Calc): 95 mg/dL (calc)
Non-HDL Cholesterol (Calc): 114 mg/dL (calc) (ref <130)
Total CHOL/HDL Ratio: 3.3 (calc) (ref <5.0)
Triglycerides: 96 mg/dL (ref <150)

## 2021-01-11 LAB — HEMOGLOBIN A1C
Hgb A1c MFr Bld: 5.8 % of total Hgb — ABNORMAL HIGH (ref <5.7)
Mean Plasma Glucose: 120 mg/dL
eAG (mmol/L): 6.6 mmol/L

## 2021-01-11 LAB — VITAMIN D 25 HYDROXY (VIT D DEFICIENCY, FRACTURES): Vit D, 25-Hydroxy: 47 ng/mL (ref 30–100)

## 2021-01-11 LAB — TSH: TSH: 1.23 mIU/L

## 2021-01-12 ENCOUNTER — Other Ambulatory Visit: Payer: Medicaid Other

## 2021-01-13 DIAGNOSIS — F909 Attention-deficit hyperactivity disorder, unspecified type: Secondary | ICD-10-CM | POA: Diagnosis not present

## 2021-01-13 DIAGNOSIS — F4310 Post-traumatic stress disorder, unspecified: Secondary | ICD-10-CM | POA: Diagnosis not present

## 2021-01-13 DIAGNOSIS — F331 Major depressive disorder, recurrent, moderate: Secondary | ICD-10-CM | POA: Diagnosis not present

## 2021-01-13 DIAGNOSIS — F411 Generalized anxiety disorder: Secondary | ICD-10-CM | POA: Diagnosis not present

## 2021-01-19 DIAGNOSIS — F909 Attention-deficit hyperactivity disorder, unspecified type: Secondary | ICD-10-CM | POA: Diagnosis not present

## 2021-01-19 DIAGNOSIS — F331 Major depressive disorder, recurrent, moderate: Secondary | ICD-10-CM | POA: Diagnosis not present

## 2021-01-19 DIAGNOSIS — F411 Generalized anxiety disorder: Secondary | ICD-10-CM | POA: Diagnosis not present

## 2021-01-19 DIAGNOSIS — F4310 Post-traumatic stress disorder, unspecified: Secondary | ICD-10-CM | POA: Diagnosis not present

## 2021-02-06 DIAGNOSIS — F331 Major depressive disorder, recurrent, moderate: Secondary | ICD-10-CM | POA: Diagnosis not present

## 2021-02-06 DIAGNOSIS — F909 Attention-deficit hyperactivity disorder, unspecified type: Secondary | ICD-10-CM | POA: Diagnosis not present

## 2021-02-06 DIAGNOSIS — F411 Generalized anxiety disorder: Secondary | ICD-10-CM | POA: Diagnosis not present

## 2021-02-06 DIAGNOSIS — F4310 Post-traumatic stress disorder, unspecified: Secondary | ICD-10-CM | POA: Diagnosis not present

## 2021-02-15 DIAGNOSIS — F331 Major depressive disorder, recurrent, moderate: Secondary | ICD-10-CM | POA: Diagnosis not present

## 2021-02-15 DIAGNOSIS — F4310 Post-traumatic stress disorder, unspecified: Secondary | ICD-10-CM | POA: Diagnosis not present

## 2021-02-15 DIAGNOSIS — F411 Generalized anxiety disorder: Secondary | ICD-10-CM | POA: Diagnosis not present

## 2021-02-15 DIAGNOSIS — F909 Attention-deficit hyperactivity disorder, unspecified type: Secondary | ICD-10-CM | POA: Diagnosis not present

## 2021-03-01 DIAGNOSIS — F909 Attention-deficit hyperactivity disorder, unspecified type: Secondary | ICD-10-CM | POA: Diagnosis not present

## 2021-03-01 DIAGNOSIS — F411 Generalized anxiety disorder: Secondary | ICD-10-CM | POA: Diagnosis not present

## 2021-03-01 DIAGNOSIS — F4310 Post-traumatic stress disorder, unspecified: Secondary | ICD-10-CM | POA: Diagnosis not present

## 2021-03-01 DIAGNOSIS — F331 Major depressive disorder, recurrent, moderate: Secondary | ICD-10-CM | POA: Diagnosis not present

## 2021-03-03 DIAGNOSIS — Z8371 Family history of colonic polyps: Secondary | ICD-10-CM | POA: Diagnosis not present

## 2021-03-03 DIAGNOSIS — K5909 Other constipation: Secondary | ICD-10-CM | POA: Diagnosis not present

## 2021-03-03 DIAGNOSIS — D649 Anemia, unspecified: Secondary | ICD-10-CM | POA: Diagnosis not present

## 2021-03-03 DIAGNOSIS — Z83719 Family history of colon polyps, unspecified: Secondary | ICD-10-CM | POA: Insufficient documentation

## 2021-03-03 DIAGNOSIS — R140 Abdominal distension (gaseous): Secondary | ICD-10-CM | POA: Diagnosis not present

## 2021-03-03 DIAGNOSIS — K921 Melena: Secondary | ICD-10-CM | POA: Insufficient documentation

## 2021-03-03 DIAGNOSIS — R1032 Left lower quadrant pain: Secondary | ICD-10-CM | POA: Insufficient documentation

## 2021-03-03 DIAGNOSIS — D509 Iron deficiency anemia, unspecified: Secondary | ICD-10-CM | POA: Diagnosis not present

## 2021-03-11 DIAGNOSIS — F331 Major depressive disorder, recurrent, moderate: Secondary | ICD-10-CM | POA: Diagnosis not present

## 2021-03-11 DIAGNOSIS — F909 Attention-deficit hyperactivity disorder, unspecified type: Secondary | ICD-10-CM | POA: Diagnosis not present

## 2021-03-11 DIAGNOSIS — F4310 Post-traumatic stress disorder, unspecified: Secondary | ICD-10-CM | POA: Diagnosis not present

## 2021-03-11 DIAGNOSIS — F411 Generalized anxiety disorder: Secondary | ICD-10-CM | POA: Diagnosis not present

## 2021-03-23 DIAGNOSIS — F909 Attention-deficit hyperactivity disorder, unspecified type: Secondary | ICD-10-CM | POA: Diagnosis not present

## 2021-03-23 DIAGNOSIS — Z20822 Contact with and (suspected) exposure to covid-19: Secondary | ICD-10-CM | POA: Diagnosis not present

## 2021-03-23 DIAGNOSIS — F331 Major depressive disorder, recurrent, moderate: Secondary | ICD-10-CM | POA: Diagnosis not present

## 2021-03-23 DIAGNOSIS — Z03818 Encounter for observation for suspected exposure to other biological agents ruled out: Secondary | ICD-10-CM | POA: Diagnosis not present

## 2021-03-23 DIAGNOSIS — F4310 Post-traumatic stress disorder, unspecified: Secondary | ICD-10-CM | POA: Diagnosis not present

## 2021-03-23 DIAGNOSIS — F411 Generalized anxiety disorder: Secondary | ICD-10-CM | POA: Diagnosis not present

## 2021-04-06 DIAGNOSIS — F331 Major depressive disorder, recurrent, moderate: Secondary | ICD-10-CM | POA: Diagnosis not present

## 2021-04-06 DIAGNOSIS — F909 Attention-deficit hyperactivity disorder, unspecified type: Secondary | ICD-10-CM | POA: Diagnosis not present

## 2021-04-06 DIAGNOSIS — F4310 Post-traumatic stress disorder, unspecified: Secondary | ICD-10-CM | POA: Diagnosis not present

## 2021-04-06 DIAGNOSIS — F411 Generalized anxiety disorder: Secondary | ICD-10-CM | POA: Diagnosis not present

## 2021-04-13 DIAGNOSIS — F909 Attention-deficit hyperactivity disorder, unspecified type: Secondary | ICD-10-CM | POA: Diagnosis not present

## 2021-04-13 DIAGNOSIS — F4310 Post-traumatic stress disorder, unspecified: Secondary | ICD-10-CM | POA: Diagnosis not present

## 2021-04-13 DIAGNOSIS — F331 Major depressive disorder, recurrent, moderate: Secondary | ICD-10-CM | POA: Diagnosis not present

## 2021-04-13 DIAGNOSIS — F411 Generalized anxiety disorder: Secondary | ICD-10-CM | POA: Diagnosis not present

## 2021-04-18 DIAGNOSIS — F909 Attention-deficit hyperactivity disorder, unspecified type: Secondary | ICD-10-CM | POA: Diagnosis not present

## 2021-04-18 DIAGNOSIS — F4310 Post-traumatic stress disorder, unspecified: Secondary | ICD-10-CM | POA: Diagnosis not present

## 2021-04-18 DIAGNOSIS — F411 Generalized anxiety disorder: Secondary | ICD-10-CM | POA: Diagnosis not present

## 2021-04-18 DIAGNOSIS — F331 Major depressive disorder, recurrent, moderate: Secondary | ICD-10-CM | POA: Diagnosis not present

## 2021-04-26 DIAGNOSIS — F331 Major depressive disorder, recurrent, moderate: Secondary | ICD-10-CM | POA: Diagnosis not present

## 2021-04-26 DIAGNOSIS — F4310 Post-traumatic stress disorder, unspecified: Secondary | ICD-10-CM | POA: Diagnosis not present

## 2021-04-26 DIAGNOSIS — F909 Attention-deficit hyperactivity disorder, unspecified type: Secondary | ICD-10-CM | POA: Diagnosis not present

## 2021-04-26 DIAGNOSIS — F411 Generalized anxiety disorder: Secondary | ICD-10-CM | POA: Diagnosis not present

## 2021-05-03 DIAGNOSIS — F4310 Post-traumatic stress disorder, unspecified: Secondary | ICD-10-CM | POA: Diagnosis not present

## 2021-05-03 DIAGNOSIS — F331 Major depressive disorder, recurrent, moderate: Secondary | ICD-10-CM | POA: Diagnosis not present

## 2021-05-03 DIAGNOSIS — F411 Generalized anxiety disorder: Secondary | ICD-10-CM | POA: Diagnosis not present

## 2021-05-03 DIAGNOSIS — F909 Attention-deficit hyperactivity disorder, unspecified type: Secondary | ICD-10-CM | POA: Diagnosis not present

## 2021-05-26 DIAGNOSIS — F909 Attention-deficit hyperactivity disorder, unspecified type: Secondary | ICD-10-CM | POA: Diagnosis not present

## 2021-05-26 DIAGNOSIS — F4310 Post-traumatic stress disorder, unspecified: Secondary | ICD-10-CM | POA: Diagnosis not present

## 2021-05-26 DIAGNOSIS — F411 Generalized anxiety disorder: Secondary | ICD-10-CM | POA: Diagnosis not present

## 2021-05-26 DIAGNOSIS — F331 Major depressive disorder, recurrent, moderate: Secondary | ICD-10-CM | POA: Diagnosis not present

## 2021-06-09 DIAGNOSIS — F331 Major depressive disorder, recurrent, moderate: Secondary | ICD-10-CM | POA: Diagnosis not present

## 2021-06-09 DIAGNOSIS — F4310 Post-traumatic stress disorder, unspecified: Secondary | ICD-10-CM | POA: Diagnosis not present

## 2021-06-09 DIAGNOSIS — F411 Generalized anxiety disorder: Secondary | ICD-10-CM | POA: Diagnosis not present

## 2021-06-09 DIAGNOSIS — F909 Attention-deficit hyperactivity disorder, unspecified type: Secondary | ICD-10-CM | POA: Diagnosis not present

## 2021-06-16 DIAGNOSIS — F331 Major depressive disorder, recurrent, moderate: Secondary | ICD-10-CM | POA: Diagnosis not present

## 2021-06-16 DIAGNOSIS — F909 Attention-deficit hyperactivity disorder, unspecified type: Secondary | ICD-10-CM | POA: Diagnosis not present

## 2021-06-16 DIAGNOSIS — F4310 Post-traumatic stress disorder, unspecified: Secondary | ICD-10-CM | POA: Diagnosis not present

## 2021-06-16 DIAGNOSIS — F411 Generalized anxiety disorder: Secondary | ICD-10-CM | POA: Diagnosis not present

## 2021-06-16 DIAGNOSIS — F900 Attention-deficit hyperactivity disorder, predominantly inattentive type: Secondary | ICD-10-CM | POA: Diagnosis not present

## 2021-06-23 DIAGNOSIS — F909 Attention-deficit hyperactivity disorder, unspecified type: Secondary | ICD-10-CM | POA: Diagnosis not present

## 2021-06-23 DIAGNOSIS — F4310 Post-traumatic stress disorder, unspecified: Secondary | ICD-10-CM | POA: Diagnosis not present

## 2021-06-23 DIAGNOSIS — F331 Major depressive disorder, recurrent, moderate: Secondary | ICD-10-CM | POA: Diagnosis not present

## 2021-06-23 DIAGNOSIS — F411 Generalized anxiety disorder: Secondary | ICD-10-CM | POA: Diagnosis not present

## 2021-06-30 DIAGNOSIS — F411 Generalized anxiety disorder: Secondary | ICD-10-CM | POA: Diagnosis not present

## 2021-06-30 DIAGNOSIS — F900 Attention-deficit hyperactivity disorder, predominantly inattentive type: Secondary | ICD-10-CM | POA: Diagnosis not present

## 2021-06-30 DIAGNOSIS — F331 Major depressive disorder, recurrent, moderate: Secondary | ICD-10-CM | POA: Diagnosis not present

## 2021-06-30 DIAGNOSIS — F909 Attention-deficit hyperactivity disorder, unspecified type: Secondary | ICD-10-CM | POA: Diagnosis not present

## 2021-06-30 DIAGNOSIS — F4310 Post-traumatic stress disorder, unspecified: Secondary | ICD-10-CM | POA: Diagnosis not present

## 2021-07-07 ENCOUNTER — Other Ambulatory Visit: Payer: Self-pay | Admitting: Family Medicine

## 2021-07-07 DIAGNOSIS — N63 Unspecified lump in unspecified breast: Secondary | ICD-10-CM

## 2021-07-27 DIAGNOSIS — F411 Generalized anxiety disorder: Secondary | ICD-10-CM | POA: Diagnosis not present

## 2021-07-27 DIAGNOSIS — F909 Attention-deficit hyperactivity disorder, unspecified type: Secondary | ICD-10-CM | POA: Diagnosis not present

## 2021-07-27 DIAGNOSIS — F4310 Post-traumatic stress disorder, unspecified: Secondary | ICD-10-CM | POA: Diagnosis not present

## 2021-07-27 DIAGNOSIS — F331 Major depressive disorder, recurrent, moderate: Secondary | ICD-10-CM | POA: Diagnosis not present

## 2021-07-28 DIAGNOSIS — F900 Attention-deficit hyperactivity disorder, predominantly inattentive type: Secondary | ICD-10-CM | POA: Diagnosis not present

## 2021-07-28 DIAGNOSIS — F331 Major depressive disorder, recurrent, moderate: Secondary | ICD-10-CM | POA: Diagnosis not present

## 2021-08-03 DIAGNOSIS — F900 Attention-deficit hyperactivity disorder, predominantly inattentive type: Secondary | ICD-10-CM | POA: Diagnosis not present

## 2021-08-03 DIAGNOSIS — F331 Major depressive disorder, recurrent, moderate: Secondary | ICD-10-CM | POA: Diagnosis not present

## 2021-08-09 ENCOUNTER — Other Ambulatory Visit: Payer: Self-pay | Admitting: Physical Medicine and Rehabilitation

## 2021-08-09 DIAGNOSIS — E049 Nontoxic goiter, unspecified: Secondary | ICD-10-CM

## 2021-08-16 ENCOUNTER — Other Ambulatory Visit: Payer: Self-pay

## 2021-08-16 ENCOUNTER — Ambulatory Visit
Admission: RE | Admit: 2021-08-16 | Discharge: 2021-08-16 | Disposition: A | Discharge status: Home / Self Care | Payer: 59 | Source: Ambulatory Visit | Attending: Physical Medicine and Rehabilitation | Admitting: Physical Medicine and Rehabilitation

## 2021-08-16 DIAGNOSIS — E049 Nontoxic goiter, unspecified: Secondary | ICD-10-CM | POA: Diagnosis not present

## 2021-08-23 DIAGNOSIS — F4310 Post-traumatic stress disorder, unspecified: Secondary | ICD-10-CM | POA: Diagnosis not present

## 2021-08-23 DIAGNOSIS — F331 Major depressive disorder, recurrent, moderate: Secondary | ICD-10-CM | POA: Diagnosis not present

## 2021-08-23 DIAGNOSIS — F411 Generalized anxiety disorder: Secondary | ICD-10-CM | POA: Diagnosis not present

## 2021-08-23 DIAGNOSIS — F909 Attention-deficit hyperactivity disorder, unspecified type: Secondary | ICD-10-CM | POA: Diagnosis not present

## 2021-08-30 DIAGNOSIS — F4310 Post-traumatic stress disorder, unspecified: Secondary | ICD-10-CM | POA: Diagnosis not present

## 2021-08-30 DIAGNOSIS — F331 Major depressive disorder, recurrent, moderate: Secondary | ICD-10-CM | POA: Diagnosis not present

## 2021-08-30 DIAGNOSIS — F411 Generalized anxiety disorder: Secondary | ICD-10-CM | POA: Diagnosis not present

## 2021-08-30 DIAGNOSIS — F909 Attention-deficit hyperactivity disorder, unspecified type: Secondary | ICD-10-CM | POA: Diagnosis not present

## 2021-09-08 DIAGNOSIS — F331 Major depressive disorder, recurrent, moderate: Secondary | ICD-10-CM | POA: Diagnosis not present

## 2021-09-08 DIAGNOSIS — F411 Generalized anxiety disorder: Secondary | ICD-10-CM | POA: Diagnosis not present

## 2021-09-08 DIAGNOSIS — F4310 Post-traumatic stress disorder, unspecified: Secondary | ICD-10-CM | POA: Diagnosis not present

## 2021-09-08 DIAGNOSIS — F909 Attention-deficit hyperactivity disorder, unspecified type: Secondary | ICD-10-CM | POA: Diagnosis not present

## 2021-09-09 ENCOUNTER — Telehealth (INDEPENDENT_AMBULATORY_CARE_PROVIDER_SITE_OTHER): Payer: Medicaid Other | Admitting: Internal Medicine

## 2021-09-09 VITALS — BP 138/86 | HR 95 | Temp 98.1°F | Resp 16 | Ht 63.0 in | Wt 177.6 lb

## 2021-09-09 DIAGNOSIS — H6982 Other specified disorders of Eustachian tube, left ear: Secondary | ICD-10-CM | POA: Diagnosis not present

## 2021-09-09 MED ORDER — FLUTICASONE PROPIONATE 50 MCG/ACT NA SUSP: 2.0000 | Freq: Every day | NASAL | 6 refills | Status: DC
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
## 2021-09-13 DIAGNOSIS — F909 Attention-deficit hyperactivity disorder, unspecified type: Secondary | ICD-10-CM | POA: Diagnosis not present

## 2021-09-13 DIAGNOSIS — F4310 Post-traumatic stress disorder, unspecified: Secondary | ICD-10-CM | POA: Diagnosis not present

## 2021-09-13 DIAGNOSIS — F411 Generalized anxiety disorder: Secondary | ICD-10-CM | POA: Diagnosis not present

## 2021-09-13 DIAGNOSIS — F331 Major depressive disorder, recurrent, moderate: Secondary | ICD-10-CM | POA: Diagnosis not present

## 2021-09-21 DIAGNOSIS — F909 Attention-deficit hyperactivity disorder, unspecified type: Secondary | ICD-10-CM | POA: Diagnosis not present

## 2021-09-21 DIAGNOSIS — F4310 Post-traumatic stress disorder, unspecified: Secondary | ICD-10-CM | POA: Diagnosis not present

## 2021-09-21 DIAGNOSIS — F411 Generalized anxiety disorder: Secondary | ICD-10-CM | POA: Diagnosis not present

## 2021-09-21 DIAGNOSIS — F331 Major depressive disorder, recurrent, moderate: Secondary | ICD-10-CM | POA: Diagnosis not present

## 2021-11-07 ENCOUNTER — Ambulatory Visit: Payer: Medicaid Other | Admitting: Physician Assistant

## 2021-11-07 ENCOUNTER — Other Ambulatory Visit: Payer: Self-pay

## 2021-11-07 ENCOUNTER — Other Ambulatory Visit: Payer: Self-pay | Admitting: Orthopedic Surgery

## 2021-11-07 ENCOUNTER — Ambulatory Visit: Payer: Self-pay | Admitting: *Deleted

## 2021-11-07 DIAGNOSIS — M25571 Pain in right ankle and joints of right foot: Secondary | ICD-10-CM

## 2021-11-07 NOTE — Telephone Encounter (Signed)
°  Chief Complaint: R calf/ankle/foot injury Symptoms: twisted ankle while traveling- heard "pop", swelling in calf and foot, limping-pain Frequency: injury occurred Friday Pertinent Negatives: Patient denies   Disposition: [] ED /[] Urgent Care (no appt availability in office) / [x] Appointment(In office/virtual)/ []  [place] [facility]/ [] Home Care/ [] Refused Recommended Disposition /[] [place] [facility]/ []  Follow-up with PCP Additional Notes:

## 2021-11-07 NOTE — Telephone Encounter (Signed)
Reason for Disposition  [1] Limp when walking AND [2] due to a twisted ankle or foot  Answer Assessment - Initial Assessment Questions 1. MECHANISM: "How did the injury happen?" (e.g., twisting injury, direct blow)      Calf and ankle injury- twisted the ankle- felt pop in calf 2. ONSET: "When did the injury happen?" (Minutes or hours ago)      Friday 3. LOCATION: "Where is the injury located?"      R calf and ankle 4. APPEARANCE of INJURY: "What does the injury look like?"      Swelling in calf and foot on the top 5. WEIGHT-BEARING: "Can you put weight on that foot?" "Can you walk (four steps or more)?"       Fri/Sat- hard to put weight on foot- limping, today- sore and hard 6. SIZE: For cuts, bruises, or swelling, ask: "How large is it?" (e.g., inches or centimeters;  entire joint)      Swelling in calf and foot 7. PAIN: "Is there pain?" If Yes, ask: "How bad is the pain?"    (e.g., Scale 1-10; or mild, moderate, severe)   - NONE (0): no pain.   - MILD (1-3): doesn't interfere with normal activities.    - MODERATE (4-7): interferes with normal activities (e.g., work or school) or awakens from sleep, limping.    - SEVERE (8-10): excruciating pain, unable to do any normal activities, unable to walk.      moderate 8. TETANUS: For any breaks in the skin, ask: "When was the last tetanus booster?"     na 9. OTHER SYMPTOMS: "Do you have any other symptoms?"      na 10. PREGNANCY: "Is there any chance you are pregnant?" "When was your last menstrual period?"       *No Answer*  Protocols used: Ankle and Foot Injury-A-AH

## 2021-11-14 ENCOUNTER — Ambulatory Visit
Admission: RE | Admit: 2021-11-14 | Discharge: 2021-11-14 | Disposition: A | Discharge status: Home / Self Care | Payer: BLUE CROSS/BLUE SHIELD | Source: Ambulatory Visit | Attending: Orthopedic Surgery | Admitting: Orthopedic Surgery

## 2021-11-14 DIAGNOSIS — M25571 Pain in right ankle and joints of right foot: Secondary | ICD-10-CM | POA: Insufficient documentation

## 2022-05-24 DIAGNOSIS — Z8659 Personal history of other mental and behavioral disorders: Secondary | ICD-10-CM | POA: Insufficient documentation

## 2022-05-24 DIAGNOSIS — N912 Amenorrhea, unspecified: Secondary | ICD-10-CM | POA: Insufficient documentation

## 2022-05-24 NOTE — Progress Notes (Unsigned)
Name: Desiree [name]   MRN: [id number]    DOB: 17-Nov-1970   Date:05/25/2022       Progress Note  Subjective  Chief Complaint  Abdominal Pain  HPI  Diverticulitis: She states she developed LLQ pain and constipation and blood in stools, described as sharp on 05/12/2022 when she was in [place], [place]. She went to local [facility] , had CT scan abdomen and was given an antibiotic but she is not sure of the name. She took medication as prescribed and LLQ pain resolved . She has reached out to her GI but is waiting for reply. She would like a refill of Bentyl  Left lower back pain: she states 5 days ago she noticed pain that radiates from Left lower back to left groin area, no rashes, pain seems worse at night and also when she starts to walk. Pain is described as tightness and muscles feels tired.Denies tingling, numbness, no bowel or bladder incontinence. Normal appetite. She has been soaking in Epson salt but only helps for a short period of time.   MDD: she has a long history of depression, currently off medication, she ran out of lexapro when she had a gap in insurance, she would like to resume lexapro. She denies suicidal thoughts or ideation.   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   LGSIL (low grade squamous intraepithelial dysplasia) 04/01/2012   Abdominal hernia 04/01/2012   Anemia 04/07/2011   Platelet dysfunction ([place]) 04/07/2011   Lupus anticoagulant positive 11/07/2010    Past Surgical History:  Procedure Laterality Date   ANAL  FISSURE REPAIR  2008   COLONOSCOPY WITH PROPOFOL N/A 07/11/2018   Procedure: COLONOSCOPY WITH PROPOFOL;  Surgeon: Lin Landsman, MD;  Location: [facility];  Service: Gastroenterology;  Laterality: N/A;   EXCISIONAL HEMORRHOIDECTOMY  1093   Bleeding complication   HERNIA REPAIR      Family History  Problem Relation Age of Onset   Stroke Mother        late 40s   Hypertension Mother    Lupus Cousin    Breast cancer Cousin 55   Cancer Father        Prostate   Healthy Brother    Hypertension Maternal Grandmother    Heart attack Maternal Grandmother        before age 92   Cancer Maternal Grandfather        Throat   Hypertension Paternal Grandmother    Cancer Paternal Grandmother        Jaw   Stroke Paternal Grandfather        before age 64   Hypertension Brother    Healthy Son    Breast cancer Cousin 41    Social History   Tobacco Use   Smoking status: Never   Smokeless tobacco: Never  Substance Use Topics   Alcohol use: No     Current Outpatient Medications:    Cholecalciferol (VITAMIN D) 50 MCG (2000 UT) CAPS, Take 1 capsule by mouth daily., Disp: , Rfl:    dicyclomine (BENTYL) 20  MG tablet, Take 1 tablet (20 mg total) by mouth 3 (three) times daily as needed for spasms (abd cramping)., Disp: 20 tablet, Rfl: 1   Misc Natural Products (CORTISOL PO), Take 1 tablet by mouth daily., Disp: , Rfl:   Allergies  Allergen Reactions   Biotin Hives and Palpitations   Nylon Itching   Cheese Other (See Comments) and Rash    I personally reviewed active problem list, medication list, allergies, family history, social history, health maintenance with the patient/caregiver today.   ROS  Ten systems reviewed and is negative except as mentioned in HPI   Objective  Vitals:   05/25/22 0852  BP: 120/76  Pulse: 94  Resp: 16  SpO2: 98%  Weight: 174 lb (78.9 kg)  Height: '5\' 2"'$  (1.575 m)    Body mass index is 31.83 kg/m.  Physical Exam  Constitutional:  Patient appears well-developed and well-nourished. Obese  No distress.  HEENT: head atraumatic, normocephalic, pupils equal and reactive to light, neck supple, throat within normal limits Cardiovascular: Normal rate, regular rhythm and normal heart sounds.  No murmur heard. No BLE edema. Pulmonary/Chest: Effort normal and breath sounds normal. No respiratory distress. Abdominal: Soft.  There is no tenderness., normal bowel sounds, no hernias, but some pain on left inguinal area Psychiatric: Patient has a normal mood and affect. behavior is normal. Judgment and thought content normal.    PHQ2/9:    05/25/2022    8:52 AM 09/09/2021    8:00 AM 01/10/2021    9:03 AM 12/30/2020    3:43 PM 06/02/2020   10:39 AM  Depression screen PHQ 2/9  Decreased Interest 0 0 1 1 0  Down, Depressed, Hopeless 0 0 0 0 0  PHQ - 2 Score 0 0 1 1 0  Altered sleeping 0 0 3 0   Tired, decreased energy 0 0 3 0   Change in appetite 0 0 3 0   Feeling bad or failure about yourself  0 0 1 0   Trouble concentrating 0 0 0 0   Moving slowly or fidgety/restless 0 0 0 0   Suicidal thoughts 0 0 0 0   PHQ-9 Score 0 0 11 1   Difficult doing work/chores Not difficult at all Not difficult at all  Not difficult at all     phq 9 is negative   Fall Risk:    05/25/2022    8:52 AM 09/09/2021    8:00 AM 01/10/2021    9:03 AM 12/30/2020    3:43 PM 06/02/2020   10:39 AM  Fall Risk   Falls in the past year? 0 0 1 1 0  Number falls in past yr: 0 0 0 0 0  Injury with Fall? 0 0 0 0 0  Risk for fall due to : No Fall Risks No Fall Risks     Follow up Falls prevention discussed;Education provided Falls prevention discussed   Falls evaluation completed      Functional Status Survey: Is the patient deaf or have difficulty hearing?: No Does the patient have difficulty seeing, even when wearing glasses/contacts?: No Does the patient have difficulty concentrating, remembering, or making decisions?: No Does the patient have difficulty  walking or climbing stairs?: No Does the patient have difficulty dressing or bathing?: No Does the patient have difficulty doing errands alone such as visiting a doctor's office or shopping?: No    Assessment & Plan  1. Mild recurrent major depression (HCC)  - escitalopram (LEXAPRO) 10 MG  tablet; Take 1 tablet (10 mg total) by mouth daily.  Dispense: 90 tablet; Refill: 0  2. Sacro-iliac pain  - diclofenac (VOLTAREN) 75 MG EC tablet; Take 1 tablet (75 mg total) by mouth 2 (two) times daily.  Dispense: 20 tablet; Refill: 0 - cyclobenzaprine (FLEXERIL) 5 MG tablet; Take 1-2 tablets (5-10 mg total) by mouth 3 (three) times daily as needed for muscle spasms.  Dispense: 30 tablet; Refill: 0  3. History of diverticulitis   4. Need for Tdap vaccination  - Tdap vaccine greater than or equal to 7yo IM  5. Need for shingles vaccine  - Varicella-zoster vaccine IM

## 2022-05-25 ENCOUNTER — Encounter: Payer: Self-pay | Admitting: Family Medicine

## 2022-05-25 ENCOUNTER — Ambulatory Visit: Payer: Medicaid Other | Admitting: Family Medicine

## 2022-05-25 VITALS — BP 120/76 | HR 94 | Resp 16 | Ht 62.0 in | Wt 174.0 lb

## 2022-05-25 DIAGNOSIS — Z23 Encounter for immunization: Secondary | ICD-10-CM

## 2022-05-25 DIAGNOSIS — M533 Sacrococcygeal disorders, not elsewhere classified: Secondary | ICD-10-CM

## 2022-05-25 DIAGNOSIS — Z8719 Personal history of other diseases of the digestive system: Secondary | ICD-10-CM | POA: Diagnosis not present

## 2022-05-25 DIAGNOSIS — F330 Major depressive disorder, recurrent, mild: Secondary | ICD-10-CM

## 2022-05-25 MED ORDER — CYCLOBENZAPRINE HCL 5 MG PO TABS: 5.0000 mg | ORAL_TABLET | Freq: Three times a day (TID) | ORAL | 0 refills | Status: DC | PRN

## 2022-05-25 MED ORDER — DICLOFENAC SODIUM 75 MG PO TBEC: 75.0000 mg | DELAYED_RELEASE_TABLET | Freq: Two times a day (BID) | ORAL | 0 refills | Status: DC

## 2022-05-25 MED ORDER — ESCITALOPRAM OXALATE 10 MG PO TABS
10.0000 mg | ORAL_TABLET | Freq: Every day | ORAL | 0 refills | Status: DC
Start: 2022-05-25 — End: 2022-08-24

## 2022-05-31 NOTE — Patient Instructions (Signed)
Preventive Care 40-51 Years Old, Female Preventive care refers to lifestyle choices and visits with your health care provider that can promote health and wellness. Preventive care visits are also called wellness exams. What can I expect for my preventive care visit? Counseling Your health care provider may ask you questions about your: Medical history, including: Past medical problems. Family medical history. Pregnancy history. Current health, including: Menstrual cycle. Method of birth control. Emotional well-being. Home life and relationship well-being. Sexual activity and sexual health. Lifestyle, including: Alcohol, nicotine or tobacco, and drug use. Access to firearms. Diet, exercise, and sleep habits. Work and work environment. Sunscreen use. Safety issues such as seatbelt and bike helmet use. Physical exam Your health care provider will check your: Height and weight. These may be used to calculate your BMI (body mass index). BMI is a measurement that tells if you are at a healthy weight. Waist circumference. This measures the distance around your waistline. This measurement also tells if you are at a healthy weight and may help predict your risk of certain diseases, such as type 2 diabetes and high blood pressure. Heart rate and blood pressure. Body temperature. Skin for abnormal spots. What immunizations do I need?  Vaccines are usually given at various ages, according to a schedule. Your health care provider will recommend vaccines for you based on your age, medical history, and lifestyle or other factors, such as travel or where you work. What tests do I need? Screening Your health care provider may recommend screening tests for certain conditions. This may include: Lipid and cholesterol levels. Diabetes screening. This is done by checking your blood sugar (glucose) after you have not eaten for a while (fasting). Pelvic exam and Pap test. Hepatitis B test. Hepatitis C  test. HIV (human immunodeficiency virus) test. STI (sexually transmitted infection) testing, if you are at risk. Lung cancer screening. Colorectal cancer screening. Mammogram. Talk with your health care provider about when you should start having regular mammograms. This may depend on whether you have a family history of breast cancer. BRCA-related cancer screening. This may be done if you have a family history of breast, ovarian, tubal, or peritoneal cancers. Bone density scan. This is done to screen for osteoporosis. Talk with your health care provider about your test results, treatment options, and if necessary, the need for more tests. Follow these instructions at home: Eating and drinking  Eat a diet that includes fresh fruits and vegetables, whole grains, lean protein, and low-fat dairy products. Take vitamin and mineral supplements as recommended by your health care provider. Do not drink alcohol if: Your health care provider tells you not to drink. You are pregnant, may be pregnant, or are planning to become pregnant. If you drink alcohol: Limit how much you have to 0-1 drink a day. Know how much alcohol is in your drink. In the [place] one drink equals one 12 oz bottle of beer (355 mL), one 5 oz glass of wine (148 mL), or one 1 oz glass of hard liquor (44 mL). Lifestyle Brush your teeth every morning and night with fluoride toothpaste. Floss one time each day. Exercise for at least 30 minutes 5 or more days each week. Do not use any products that contain nicotine or tobacco. These products include cigarettes, chewing tobacco, and vaping devices, such as e-cigarettes. If you need help quitting, ask your health care provider. Do not use drugs. If you are sexually active, practice safe sex. Use a condom or other form of protection to   prevent STIs. If you do not wish to become pregnant, use a form of birth control. If you plan to become pregnant, see your health care provider for a  prepregnancy visit. Take aspirin only as told by your health care provider. Make sure that you understand how much to take and what form to take. Work with your health care provider to find out whether it is safe and beneficial for you to take aspirin daily. Find healthy ways to manage stress, such as: Meditation, yoga, or listening to music. Journaling. Talking to a trusted person. Spending time with friends and family. Minimize exposure to UV radiation to reduce your risk of skin cancer. Safety Always wear your seat belt while driving or riding in a vehicle. Do not drive: If you have been drinking alcohol. Do not ride with someone who has been drinking. When you are tired or distracted. While texting. If you have been using any mind-altering substances or drugs. Wear a helmet and other protective equipment during sports activities. If you have firearms in your house, make sure you follow all gun safety procedures. Seek help if you have been physically or sexually abused. What's next? Visit your health care provider once a year for an annual wellness visit. Ask your health care provider how often you should have your eyes and teeth checked. Stay up to date on all vaccines. This information is not intended to replace advice given to you by your health care provider. Make sure you discuss any questions you have with your health care provider. Document Revised: 03/09/2021 Document Reviewed: 03/09/2021 Elsevier Patient Education  [place].
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
## 2022-06-01 ENCOUNTER — Ambulatory Visit (INDEPENDENT_AMBULATORY_CARE_PROVIDER_SITE_OTHER): Payer: Medicaid Other | Admitting: Family Medicine

## 2022-06-01 ENCOUNTER — Encounter: Payer: Self-pay | Admitting: Family Medicine

## 2022-06-01 ENCOUNTER — Other Ambulatory Visit (HOSPITAL_COMMUNITY)
Admission: RE | Admit: 2022-06-01 | Discharge: 2022-06-01 | Disposition: A | Discharge status: Home / Self Care | Payer: Medicaid Other | Source: Ambulatory Visit | Attending: Family Medicine | Admitting: Family Medicine

## 2022-06-01 VITALS — BP 118/74 | HR 89 | Resp 16 | Ht 63.0 in | Wt 173.0 lb

## 2022-06-01 DIAGNOSIS — Z0000 Encounter for general adult medical examination without abnormal findings: Secondary | ICD-10-CM | POA: Diagnosis not present

## 2022-06-01 DIAGNOSIS — Z87448 Personal history of other diseases of urinary system: Secondary | ICD-10-CM

## 2022-06-01 DIAGNOSIS — Z124 Encounter for screening for malignant neoplasm of cervix: Secondary | ICD-10-CM

## 2022-06-01 DIAGNOSIS — Z79899 Other long term (current) drug therapy: Secondary | ICD-10-CM

## 2022-06-01 DIAGNOSIS — R7303 Prediabetes: Secondary | ICD-10-CM | POA: Diagnosis not present

## 2022-06-01 DIAGNOSIS — N6489 Other specified disorders of breast: Secondary | ICD-10-CM

## 2022-06-01 DIAGNOSIS — E559 Vitamin D deficiency, unspecified: Secondary | ICD-10-CM | POA: Diagnosis not present

## 2022-06-01 DIAGNOSIS — Z1231 Encounter for screening mammogram for malignant neoplasm of breast: Secondary | ICD-10-CM | POA: Diagnosis not present

## 2022-06-01 DIAGNOSIS — Z13220 Encounter for screening for lipoid disorders: Secondary | ICD-10-CM | POA: Diagnosis not present

## 2022-06-02 LAB — URINALYSIS, COMPLETE
Bacteria, UA: NONE SEEN /HPF
Bilirubin Urine: NEGATIVE
Glucose, UA: NEGATIVE
Hyaline Cast: NONE SEEN /LPF
Ketones, ur: NEGATIVE
Leukocytes,Ua: NEGATIVE
Nitrite: NEGATIVE
Protein, ur: NEGATIVE
Specific Gravity, Urine: 1.023 (ref 1.001–1.035)
pH: 8 (ref 5.0–8.0)

## 2022-06-02 LAB — HEMOGLOBIN A1C
Hgb A1c MFr Bld: 5.6 % of total Hgb (ref <5.7)
Mean Plasma Glucose: 114 mg/dL
eAG (mmol/L): 6.3 mmol/L

## 2022-06-02 LAB — CBC WITH DIFFERENTIAL/PLATELET
Absolute Monocytes: 354 cells/uL (ref 200–950)
Basophils Absolute: 32 cells/uL (ref 0–200)
Basophils Relative: 0.7 %
Eosinophils Absolute: 101 cells/uL (ref 15–500)
Eosinophils Relative: 2.2 %
HCT: 34.9 % — ABNORMAL LOW (ref 35.0–45.0)
Hemoglobin: 11.2 g/dL — ABNORMAL LOW (ref 11.7–15.5)
Lymphs Abs: 2070 cells/uL (ref 850–3900)
MCH: 25.6 pg — ABNORMAL LOW (ref 27.0–33.0)
MCHC: 32.1 g/dL (ref 32.0–36.0)
MCV: 79.9 fL — ABNORMAL LOW (ref 80.0–100.0)
MPV: 10 fL (ref 7.5–12.5)
Monocytes Relative: 7.7 %
Neutro Abs: 2042 cells/uL (ref 1500–7800)
Neutrophils Relative %: 44.4 %
Platelets: 319 10*3/uL (ref 140–400)
RBC: 4.37 10*6/uL (ref 3.80–5.10)
RDW: 13.2 % (ref 11.0–15.0)
Total Lymphocyte: 45 %
WBC: 4.6 10*3/uL (ref 3.8–10.8)

## 2022-06-02 LAB — COMPLETE METABOLIC PANEL WITH GFR
AG Ratio: 1.7 (calc) (ref 1.0–2.5)
ALT: 22 U/L (ref 6–29)
AST: 20 U/L (ref 10–35)
Albumin: 4.3 g/dL (ref 3.6–5.1)
Alkaline phosphatase (APISO): 69 U/L (ref 37–153)
BUN: 9 mg/dL (ref 7–25)
CO2: 31 mmol/L (ref 20–32)
Calcium: 9.5 mg/dL (ref 8.6–10.4)
Chloride: 105 mmol/L (ref 98–110)
Creat: 0.82 mg/dL (ref 0.50–1.03)
Globulin: 2.6 g/dL (calc) (ref 1.9–3.7)
Glucose, Bld: 86 mg/dL (ref 65–99)
Potassium: 4.3 mmol/L (ref 3.5–5.3)
Sodium: 142 mmol/L (ref 135–146)
Total Bilirubin: 0.4 mg/dL (ref 0.2–1.2)
Total Protein: 6.9 g/dL (ref 6.1–8.1)
eGFR: 87 mL/min/{1.73_m2} (ref >=60)

## 2022-06-02 LAB — LIPID PANEL
Cholesterol: 164 mg/dL (ref <200)
HDL: 44 mg/dL — ABNORMAL LOW (ref >=50)
LDL Cholesterol (Calc): 104 mg/dL (calc) — ABNORMAL HIGH
Non-HDL Cholesterol (Calc): 120 mg/dL (calc) (ref <130)
Total CHOL/HDL Ratio: 3.7 (calc) (ref <5.0)
Triglycerides: 75 mg/dL (ref <150)

## 2022-06-02 LAB — VITAMIN D 25 HYDROXY (VIT D DEFICIENCY, FRACTURES): Vit D, 25-Hydroxy: 32 ng/mL (ref 30–100)

## 2022-06-12 LAB — CYTOLOGY - PAP
Comment: NEGATIVE
Diagnosis: NEGATIVE
Diagnosis: REACTIVE
High risk HPV: NEGATIVE

## 2022-08-01 ENCOUNTER — Ambulatory Visit (INDEPENDENT_AMBULATORY_CARE_PROVIDER_SITE_OTHER): Payer: Medicaid Other

## 2022-08-01 DIAGNOSIS — Z23 Encounter for immunization: Secondary | ICD-10-CM

## 2022-08-23 NOTE — Progress Notes (Unsigned)
Name: Desiree [name]   MRN: [id number]    DOB: 08-03-1971   Date:08/24/2022       Progress Note  Subjective  Chief Complaint  Follow Up  HPI  History of diverticulitis two flares in 2023. She states last episode she called GI but unable to schedule a visit. Symptoms resolved   MDD: she has a long history of depression, I gave her a refill of Lexapro in August , she states she stopped taking - she is not sure why. Phq 9 is high , she also lost to follow up with therapist. Discussed pill box and leave it by toothbrush  Anemia/prolonged PTT/platelet dysfunction  : seen by hematologist at [facility] and since levels stabilized she was released from there care.. She could not have tonsillectomy as a child due to dysfunction of platelets, and had to see hematologist during her pregnancy. She denies easy bruising or easy bleeding   OSA: she is under the care of pulmonologist , she is due for follow up,  she wears mask every night all night. Pressure is 13 cmH2O.   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   Lupus anticoagulant positive 11/07/2010    Past Surgical History:  Procedure Laterality Date   ANAL FISSURE REPAIR  2008   COLONOSCOPY WITH PROPOFOL N/A 07/11/2018   Procedure: COLONOSCOPY WITH PROPOFOL;  Surgeon: Lin Landsman, MD;  Location: [place];  Service: Gastroenterology;  Laterality: N/A;    EXCISIONAL HEMORRHOIDECTOMY  0786   Bleeding complication   HERNIA REPAIR      Family History  Problem Relation Age of Onset   Stroke Mother        late 20s   Hypertension Mother    Lupus Cousin    Breast cancer Cousin 53   Cancer Father        Prostate   Healthy Brother    Hypertension Maternal Grandmother    Heart attack Maternal Grandmother        before age 88   Cancer Maternal Grandfather        Throat   Hypertension Paternal Grandmother    Cancer Paternal Grandmother        Jaw   Stroke Paternal Grandfather        before age 58   Hypertension Brother    Healthy Son    Breast cancer Cousin 52    Social History   Tobacco Use   Smoking status: Never   Smokeless tobacco: Never  Substance Use Topics   Alcohol use: No     Current Outpatient Medications:    Cholecalciferol (VITAMIN D) 50 MCG (2000 UT) CAPS, Take 1 capsule by mouth daily., Disp: , Rfl:    diclofenac (VOLTAREN) 75 MG EC tablet, Take 1 tablet (75 mg total) by mouth 2 (two) times daily., Disp: 20 tablet, Rfl: 0   Misc Natural Products (CORTISOL PO), Take 1 tablet by  mouth daily., Disp: , Rfl:    cyclobenzaprine (FLEXERIL) 5 MG tablet, Take 1-2 tablets (5-10 mg total) by mouth 3 (three) times daily as needed for muscle spasms. (Patient not taking: Reported on 08/24/2022), Disp: 30 tablet, Rfl: 0   escitalopram (LEXAPRO) 10 MG tablet, Take 1 tablet (10 mg total) by mouth daily. (Patient not taking: Reported on 08/24/2022), Disp: 90 tablet, Rfl: 0  Allergies  Allergen Reactions   Biotin Hives and Palpitations   Nylon Itching   Cheese Other (See Comments) and Rash    I personally reviewed active problem list, medication list, allergies, family history, social history, health maintenance with the patient/caregiver today.   ROS  Constitutional: Negative for fever, positive for  weight change.  Respiratory: Negative for cough and shortness of breath.   Cardiovascular: Negative for chest pain or  palpitations.  Gastrointestinal: Negative for abdominal pain, no bowel changes.  Musculoskeletal: Negative for gait problem or joint swelling.  Skin: Negative for rash.  Neurological: Negative for dizziness or headache.  No other specific complaints in a complete review of systems (except as listed in HPI above).   Objective  Vitals:   08/24/22 1019  BP: 118/80  Pulse: 95  Resp: 16  Temp: 98.5 F (36.9 C)  TempSrc: Oral  SpO2: 96%  Weight: 166 lb (75.3 kg)  Height: 5' 3" (1.6 m)    Body mass index is 29.41 kg/m.  Physical Exam  Constitutional: Patient appears well-developed and well-nourished. No distress.  HEENT: head atraumatic, normocephalic, pupils equal and reactive to light, neck supple Cardiovascular: Normal rate, regular rhythm and normal heart sounds.  No murmur heard. No BLE edema. Pulmonary/Chest: Effort normal and breath sounds normal. No respiratory distress. Abdominal: Soft.  There is no tenderness. Psychiatric: Patient has a normal mood and affect. behavior is normal. Judgment and thought content normal.   Recent Results (from the past 2160 hour(s))  Cytology - PAP     Status: None   Collection Time: 06/01/22 11:45 AM  Result Value Ref Range   High risk HPV Negative    Adequacy      Satisfactory for evaluation; transformation zone component PRESENT.   Diagnosis      - Negative for Intraepithelial Lesions or Malignancy (NILM)   Diagnosis - Benign reactive/reparative changes    Comment Normal Reference Range HPV - Negative   Lipid panel     Status: Abnormal   Collection Time: 06/01/22 11:59 AM  Result Value Ref Range   Cholesterol 164 <200 mg/dL   HDL 44 (L) > OR = 50 mg/dL   Triglycerides 75 <150 mg/dL   LDL Cholesterol (Calc) 104 (H) mg/dL (calc)    Comment: Reference range: <100 . Desirable range <100 mg/dL for primary prevention;   <70 mg/dL for patients with CHD or diabetic patients  with > or = 2 CHD risk factors. Marland Kitchen LDL-C is now calculated  using the Martin-Hopkins  calculation, which is a validated novel method providing  better accuracy than the Friedewald equation in the  estimation of LDL-C.  Cresenciano Genre et al. Annamaria Helling. 2440;102(72): 2061-2068  (http://education.[facility].com/faq/FAQ164)    Total CHOL/HDL Ratio 3.7 <5.0 (calc)   Non-HDL Cholesterol (Calc) 120 <130 mg/dL (calc)    Comment: For patients with diabetes plus 1 major ASCVD risk  factor, treating to a non-HDL-C goal of <100 mg/dL  (LDL-C of <70 mg/dL) is considered a therapeutic  option.   CBC with Differential/Platelet     Status: Abnormal   Collection Time: 06/01/22  11:59 AM  Result Value Ref Range   WBC 4.6 3.8 - 10.8 Thousand/uL   RBC 4.37 3.80 - 5.10 Million/uL   Hemoglobin 11.2 (L) 11.7 - 15.5 g/dL   HCT 34.9 (L) 35.0 - 45.0 %   MCV 79.9 (L) 80.0 - 100.0 fL   MCH 25.6 (L) 27.0 - 33.0 pg   MCHC 32.1 32.0 - 36.0 g/dL   RDW 13.2 11.0 - 15.0 %   Platelets 319 140 - 400 Thousand/uL   MPV 10.0 7.5 - 12.5 fL   Neutro Abs 2,042 1,500 - 7,800 cells/uL   Lymphs Abs 2,070 850 - 3,900 cells/uL   Absolute Monocytes 354 200 - 950 cells/uL   Eosinophils Absolute 101 15 - 500 cells/uL   Basophils Absolute 32 0 - 200 cells/uL   Neutrophils Relative % 44.4 %   Total Lymphocyte 45.0 %   Monocytes Relative 7.7 %   Eosinophils Relative 2.2 %   Basophils Relative 0.7 %   Smear Review      Comment: Review of peripheral smear confirms automated results.   COMPLETE METABOLIC PANEL WITH GFR     Status: None   Collection Time: 06/01/22 11:59 AM  Result Value Ref Range   Glucose, Bld 86 65 - 99 mg/dL    Comment: .            Fasting reference interval .    BUN 9 7 - 25 mg/dL   Creat 0.82 0.50 - 1.03 mg/dL   eGFR 87 > OR = 60 mL/min/1.73m2   BUN/Creatinine Ratio SEE NOTE: 6 - 22 (calc)    Comment:    Not Reported: BUN and Creatinine are within    reference range. .    Sodium 142 135 - 146 mmol/L   Potassium 4.3 3.5 - 5.3 mmol/L   Chloride 105 98 - 110  mmol/L   CO2 31 20 - 32 mmol/L   Calcium 9.5 8.6 - 10.4 mg/dL   Total Protein 6.9 6.1 - 8.1 g/dL   Albumin 4.3 3.6 - 5.1 g/dL   Globulin 2.6 1.9 - 3.7 g/dL (calc)   AG Ratio 1.7 1.0 - 2.5 (calc)   Total Bilirubin 0.4 0.2 - 1.2 mg/dL   Alkaline phosphatase (APISO) 69 37 - 153 U/L   AST 20 10 - 35 U/L   ALT 22 6 - 29 U/L  Hemoglobin A1c     Status: None   Collection Time: 06/01/22 11:59 AM  Result Value Ref Range   Hgb A1c MFr Bld 5.6 <5.7 % of total Hgb    Comment: For the purpose of screening for the presence of diabetes: . <5.7%       Consistent with the absence of diabetes 5.7-6.4%    Consistent with increased risk for diabetes             (prediabetes) > or =6.5%  Consistent with diabetes . This assay result is consistent with a decreased risk of diabetes. . Currently, no consensus exists regarding use of hemoglobin A1c for diagnosis of diabetes in children. . According to American Diabetes Association (ADA) guidelines, hemoglobin A1c <7.0% represents optimal control in non-pregnant diabetic patients. Different metrics may apply to specific patient populations.  Standards of Medical Care in Diabetes(ADA). .    Mean Plasma Glucose 114 mg/dL   eAG (mmol/L) 6.3 mmol/L  Urinalysis, Complete     Status: Abnormal   Collection Time: 06/01/22 11:59 AM  Result Value Ref Range   Color, Urine YELLOW YELLOW     APPearance CLEAR CLEAR   Specific Gravity, Urine 1.023 1.001 - 1.035   pH 8.0 5.0 - 8.0   Glucose, UA NEGATIVE NEGATIVE   Bilirubin Urine NEGATIVE NEGATIVE   Ketones, ur NEGATIVE NEGATIVE   Hgb urine dipstick TRACE (A) NEGATIVE   Protein, ur NEGATIVE NEGATIVE   Nitrite NEGATIVE NEGATIVE   Leukocytes,Ua NEGATIVE NEGATIVE   WBC, UA 0-5 0 - 5 /HPF   RBC / HPF 0-2 0 - 2 /HPF   Squamous Epithelial / LPF 0-5 < OR = 5 /HPF   Bacteria, UA NONE SEEN NONE SEEN /HPF   Hyaline Cast NONE SEEN NONE SEEN /LPF   Note      Comment: This urine was analyzed for the presence of WBC,   RBC, bacteria, casts, and other formed elements.  Only those elements seen were reported. . .   VITAMIN D 25 Hydroxy (Vit-D Deficiency, Fractures)     Status: None   Collection Time: 06/01/22 11:59 AM  Result Value Ref Range   Vit D, 25-Hydroxy 32 30 - 100 ng/mL    Comment: Vitamin D Status         25-OH Vitamin D: . Deficiency:                    <20 ng/mL Insufficiency:             20 - 29 ng/mL Optimal:                 > or = 30 ng/mL . For 25-OH Vitamin D testing on patients on  D2-supplementation and patients for whom quantitation  of D2 and D3 fractions is required, the [facility](TM) 25-OH VIT D, (D2,D3), LC/MS/MS is recommended: order  code [id number] (patients >69yr). . See Note 1 . Note 1 . For additional information, please refer to  http://education.[facility].com/faq/FAQ199  (This link is being provided for informational/ educational purposes only.)     PHQ2/9:    08/24/2022   10:18 AM 06/01/2022   11:36 AM 06/01/2022   11:23 AM 05/25/2022    8:52 AM 09/09/2021    8:00 AM  Depression screen PHQ 2/9  Decreased Interest _0 0 0  Down, Depressed, Hopeless _1 0 0  PHQ - 2 Score _2 0 0  Altered sleeping _3 0 0  Tired, decreased energy _4 0 0  Change in appetite 1 0 0 0 0  Feeling bad or failure about yourself  _5 0 0  Trouble concentrating _6 0 0  Moving slowly or fidgety/restless 1 0 0 0 0  Suicidal thoughts 0 0 0 0 0  PHQ-9 Score _7 0 0  Difficult doing work/chores Very difficult   Not difficult at all Not difficult at all    phq 9 is positive   Fall Risk:    08/24/2022   10:18 AM 06/01/2022   11:23 AM 05/25/2022    8:52 AM 09/09/2021    8:00 AM 01/10/2021    9:03 AM  Fall Risk   Falls in the past year? 1 1 0 0 1  Number falls in past yr: 1 0 0 0 0  Injury with Fall? 1 1 0 0 0  Risk for fall due to : Impaired balance/gait No Fall Risks No Fall Risks No Fall Risks   Follow up Falls prevention discussed;Education  provided;Falls evaluation completed Falls prevention discussed Falls prevention discussed;Education provided Falls prevention  discussed      Functional Status Survey: Is the patient deaf or have difficulty hearing?: No Does the patient have difficulty seeing, even when wearing glasses/contacts?: No Does the patient have difficulty concentrating, remembering, or making decisions?: Yes Does the patient have difficulty walking or climbing stairs?: No Does the patient have difficulty dressing or bathing?: No Does the patient have difficulty doing errands alone such as visiting a doctor's office or shopping?: Yes    Assessment & Plan  1. Platelet dysfunction (HCC)  No easy bleeding   2. Moderate recurrent major depression (HCC)  - escitalopram (LEXAPRO) 10 MG tablet; Take 1 tablet (10 mg total) by mouth daily.  Dispense: 90 tablet; Refill: 0  3. Pre-diabetes  Continue life style modification  4. Vitamin D deficiency  Resume taking it  5. Obstructive sleep apnea syndrome  Continue pulmonologist   6. Diverticular disease  No recent episodes, discussed sending messages through mychart  

## 2022-08-24 ENCOUNTER — Encounter: Payer: Self-pay | Admitting: Family Medicine

## 2022-08-24 ENCOUNTER — Ambulatory Visit (INDEPENDENT_AMBULATORY_CARE_PROVIDER_SITE_OTHER): Payer: Commercial Managed Care - HMO | Admitting: Family Medicine

## 2022-08-24 VITALS — BP 118/80 | HR 95 | Temp 98.5°F | Resp 16 | Ht 63.0 in | Wt 166.0 lb

## 2022-08-24 DIAGNOSIS — K5790 Diverticulosis of intestine, part unspecified, without perforation or abscess without bleeding: Secondary | ICD-10-CM | POA: Diagnosis not present

## 2022-08-24 DIAGNOSIS — E559 Vitamin D deficiency, unspecified: Secondary | ICD-10-CM | POA: Diagnosis not present

## 2022-08-24 DIAGNOSIS — R7303 Prediabetes: Secondary | ICD-10-CM | POA: Diagnosis not present

## 2022-08-24 DIAGNOSIS — F331 Major depressive disorder, recurrent, moderate: Secondary | ICD-10-CM

## 2022-08-24 DIAGNOSIS — G4733 Obstructive sleep apnea (adult) (pediatric): Secondary | ICD-10-CM

## 2022-08-24 DIAGNOSIS — D691 Qualitative platelet defects: Secondary | ICD-10-CM | POA: Diagnosis not present

## 2022-08-24 MED ORDER — ESCITALOPRAM OXALATE 10 MG PO TABS: 10.0000 mg | ORAL_TABLET | Freq: Every day | ORAL | 0 refills | Status: DC

## 2022-10-10 ENCOUNTER — Ambulatory Visit
Admission: RE | Admit: 2022-10-10 | Discharge: 2022-10-10 | Disposition: A | Discharge status: Home / Self Care | Payer: Medicaid Other | Source: Ambulatory Visit | Attending: Family Medicine | Admitting: Family Medicine

## 2022-10-10 DIAGNOSIS — Z1231 Encounter for screening mammogram for malignant neoplasm of breast: Secondary | ICD-10-CM | POA: Insufficient documentation

## 2022-10-11 ENCOUNTER — Other Ambulatory Visit: Payer: Self-pay | Admitting: Family Medicine

## 2022-10-11 DIAGNOSIS — R928 Other abnormal and inconclusive findings on diagnostic imaging of breast: Secondary | ICD-10-CM

## 2022-10-11 DIAGNOSIS — N63 Unspecified lump in unspecified breast: Secondary | ICD-10-CM

## 2022-10-17 ENCOUNTER — Ambulatory Visit
Admission: RE | Admit: 2022-10-17 | Discharge: 2022-10-17 | Disposition: A | Discharge status: Home / Self Care | Payer: Medicaid Other | Source: Ambulatory Visit | Attending: Family Medicine | Admitting: Family Medicine

## 2022-10-17 DIAGNOSIS — R928 Other abnormal and inconclusive findings on diagnostic imaging of breast: Secondary | ICD-10-CM | POA: Diagnosis not present

## 2022-10-17 DIAGNOSIS — N63 Unspecified lump in unspecified breast: Secondary | ICD-10-CM | POA: Insufficient documentation

## 2022-10-17 DIAGNOSIS — N6012 Diffuse cystic mastopathy of left breast: Secondary | ICD-10-CM | POA: Diagnosis not present

## 2022-12-15 IMAGING — US US EXTREM LOW VENOUS*R*
1 series · 13 of 24 positions shown · non-contrast
Comparison: None.

CLINICAL DATA: Pain in right ankle and joints of right foot



[Series 1: us venous img lower uni right (dvt) · portal-venous · 45 acquisitions, 13 frames shown]
[im 1/45]
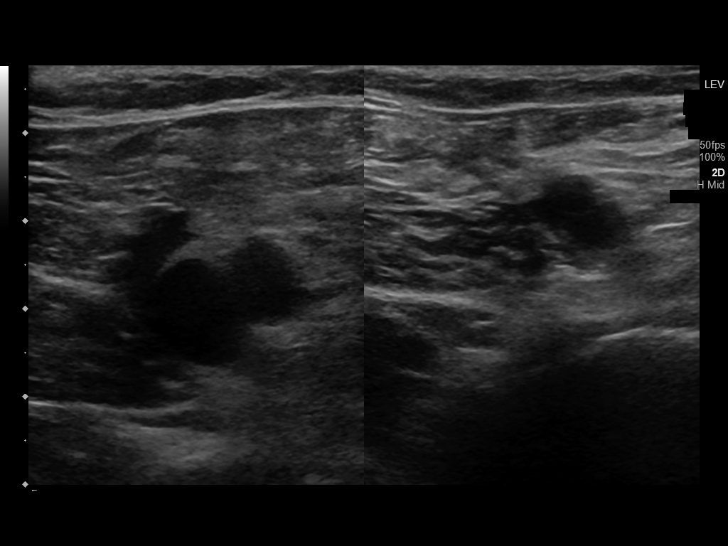
[im 4/45]
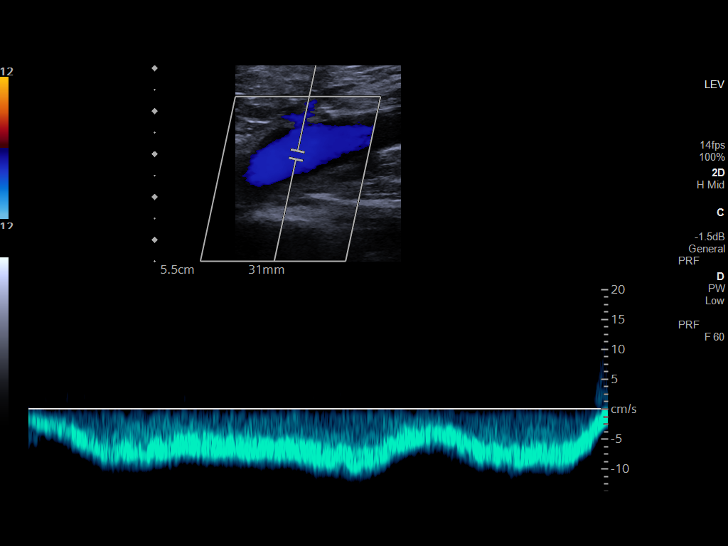
[im 8/45]
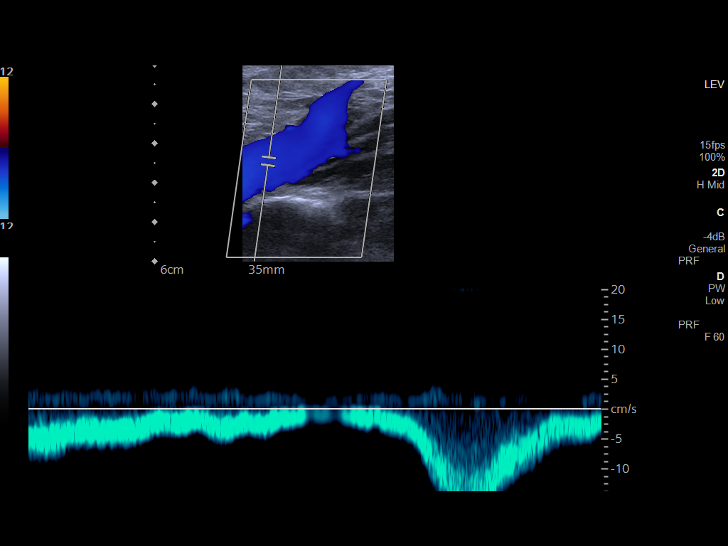
[im 12/45]
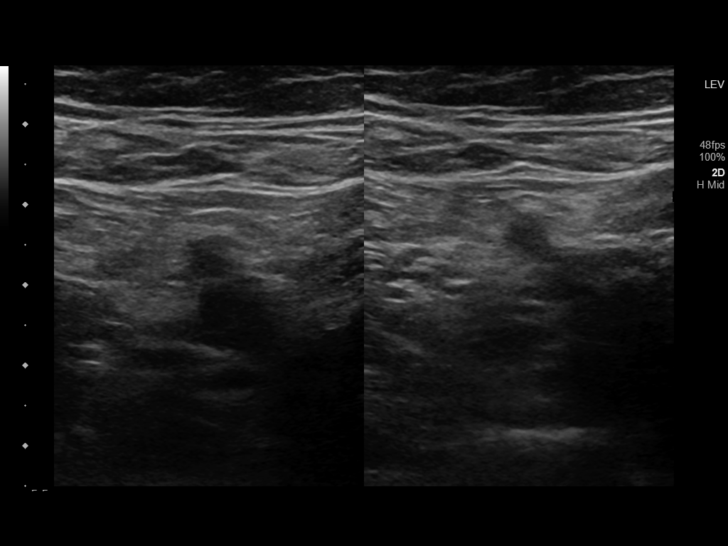
[im 16/45]
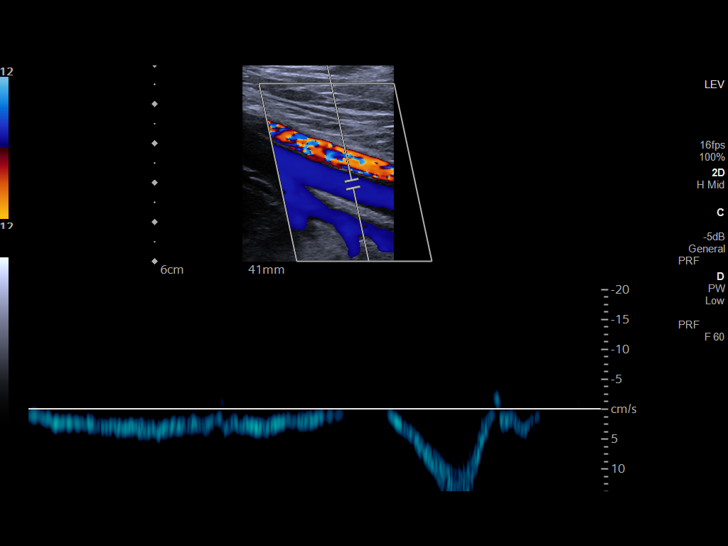
[im 20/45]
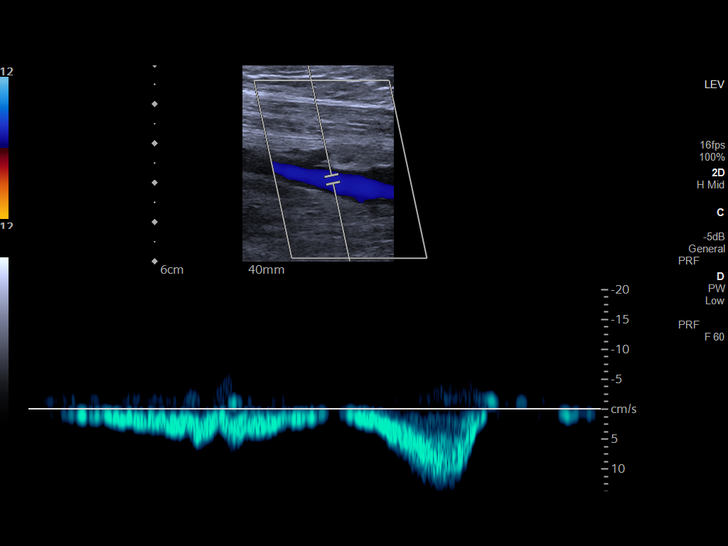
[im 25/45]
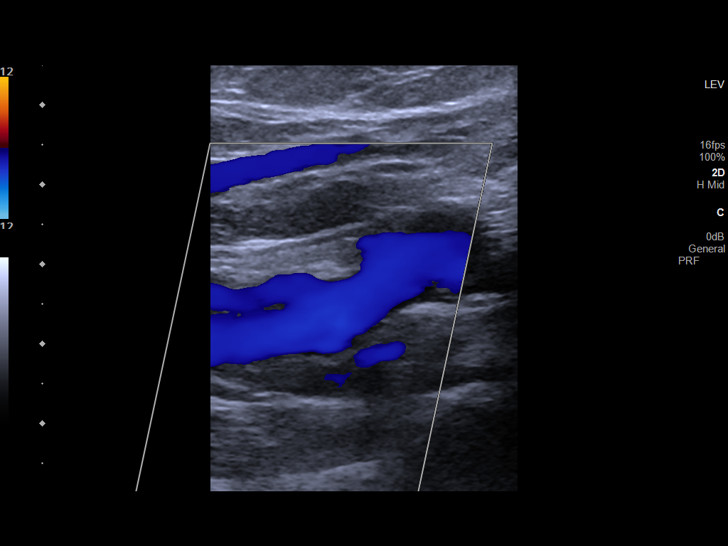
[im 27/45]
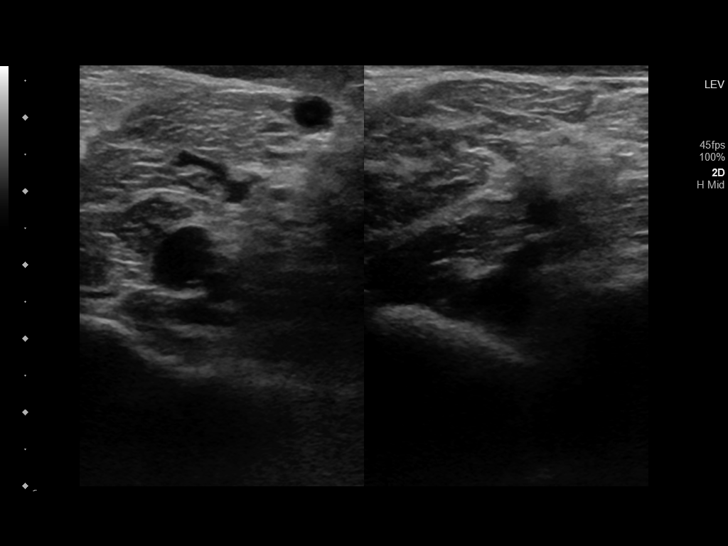
[im 31/45]
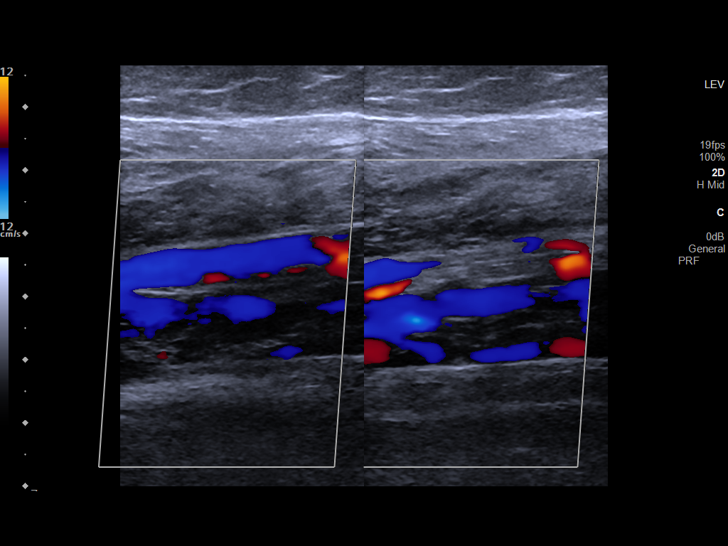
[im 35/45]
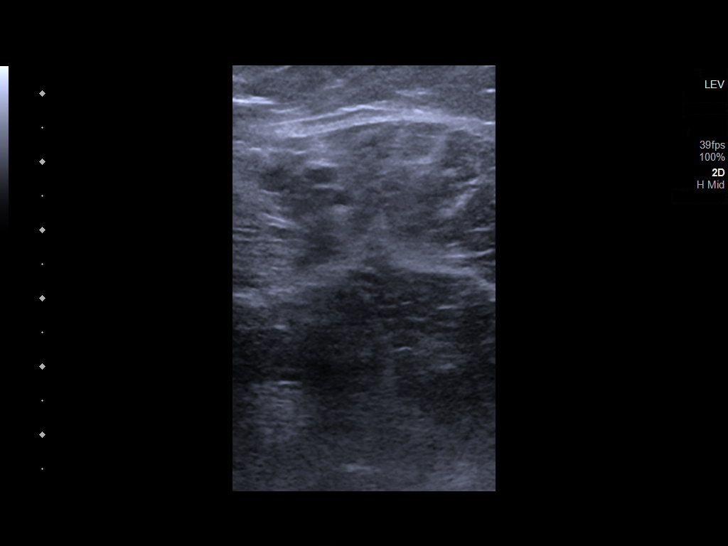
[im 37/45]
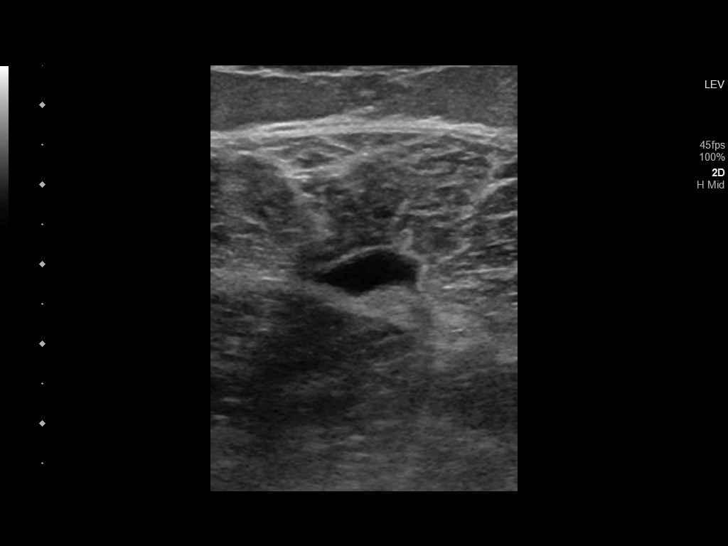
[im 41/45]
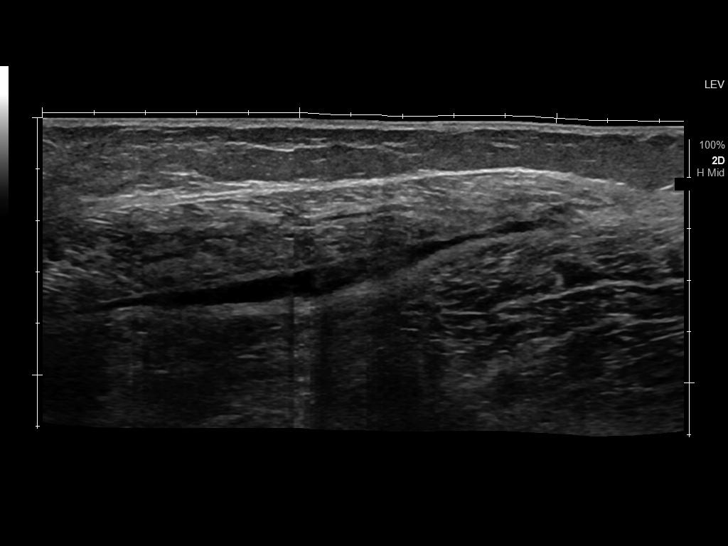
[im 45/45]
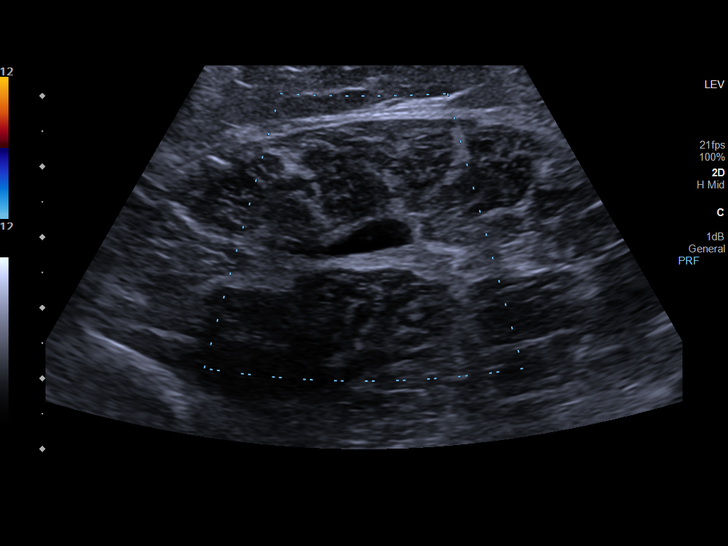

[13 of 24 positions shown; findings below may reference images not displayed]

FINDINGS: Contralateral Common Femoral Vein: Respiratory phasicity is normal
and symmetric with the symptomatic side. No evidence of thrombus.
Normal compressibility.

Common Femoral Vein: No evidence of thrombus. Normal
compressibility, respiratory phasicity and response to augmentation.

Saphenofemoral Junction: No evidence of thrombus. Normal
compressibility and flow on color Doppler imaging.

Profunda Femoral Vein: No evidence of thrombus. Normal
compressibility and flow on color Doppler imaging.

Femoral Vein: No evidence of thrombus. Normal compressibility,
respiratory phasicity and response to augmentation.

Popliteal Vein: No evidence of thrombus. Normal compressibility,
respiratory phasicity and response to augmentation.

Calf Veins: No evidence of thrombus. Normal compressibility and flow
on color Doppler imaging.

Other Findings: In medial posterior calf, there is a anechoic area
measuring 10.1 x 0.6 x 1.6 cm.
IMPRESSION: 1.  No evidence of deep venous thrombosis.

2. In the medial posterior calf, there is an elongated, thin
anechoic area measuring 10.1 x 0.6 x 1.6 cm. Given history of recent
trauma, findings may represent a closed soft tissue injury
([place] lesion).

## 2022-12-25 DIAGNOSIS — S91332A Puncture wound without foreign body, left foot, initial encounter: Secondary | ICD-10-CM | POA: Diagnosis not present

## 2022-12-25 DIAGNOSIS — L089 Local infection of the skin and subcutaneous tissue, unspecified: Secondary | ICD-10-CM | POA: Diagnosis not present

## 2023-06-07 ENCOUNTER — Encounter: Payer: 59 | Admitting: Family Medicine

## 2023-06-29 ENCOUNTER — Ambulatory Visit: Payer: 59 | Admitting: Nurse Practitioner

## 2023-06-29 NOTE — Progress Notes (Deleted)
LMP 12/19/2015    Subjective:    [name], female    DOB: [birth year], 52 y.o.   MRN: [id number]  HPI: Desiree [name] is a 52 y.o. female  No chief complaint on file.  URINARY SYMPTOMS  Dysuria: {Blank single:19197::"yes","no","burning"} Urinary frequency: {Blank single:19197::"yes","no"} Urgency: {Blank single:19197::"yes","no"} Small volume voids: {Blank single:19197::"yes","no"} Symptom severity: {Blank single:19197::"yes","no"} Urinary incontinence: {Blank single:19197::"yes","no"} Foul odor: {Blank single:19197::"yes","no"} Hematuria: {Blank single:19197::"yes","no"} Abdominal pain: {Blank single:19197::"yes","no"} Back pain: {Blank single:19197::"yes","no"} Suprapubic pain/pressure: {Blank single:19197::"yes","no"} Flank pain: {Blank single:19197::"yes","no"} Fever:  {Blank multiple:19196::"yes","no","subjective","low grade"} Vomiting: {Blank single:19197::"yes","no"} Relief with cranberry juice: {Blank single:19197::"yes","no"} Relief with pyridium: {Blank single:19197::"yes","no"} Status: better/worse/stable Previous urinary tract infection: {Blank single:19197::"yes","no"} Recurrent urinary tract infection: {Blank single:19197::"yes","no"} Sexual activity: No sexually active/monogomous/practicing safe sex History of sexually transmitted disease: {Blank single:19197::"yes","no"} Penile discharge: {Blank single:19197::"yes","no"} Treatments attempted: {Blank multiple:19196::"none","antibiotics","pyridium","cranberry","increasing fluids"}    Relevant past medical, surgical, family and social history reviewed and updated as indicated. Interim medical history since our last visit reviewed. Allergies and medications reviewed and updated.  Review of Systems  Constitutional: Negative for fever or weight change.  Respiratory: Negative for cough and shortness of breath.   Cardiovascular: Negative for chest pain or palpitations.  Gastrointestinal:  Negative for abdominal pain, no bowel changes.  Musculoskeletal: Negative for gait problem or joint swelling.  Skin: Negative for rash.  Neurological: Negative for dizziness or headache.  No other specific complaints in a complete review of systems (except as listed in HPI above).      Objective:    LMP 12/19/2015   Wt Readings from Last 3 Encounters:  08/24/22 166 lb (75.3 kg)  06/01/22 173 lb (78.5 kg)  05/25/22 174 lb (78.9 kg)    Physical Exam  Constitutional: Patient appears well-developed and well-nourished. Obese *** No distress.  HEENT: head atraumatic, normocephalic, pupils equal and reactive to light, ears ***, neck supple, throat within normal limits Cardiovascular: Normal rate, regular rhythm and normal heart sounds.  No murmur heard. No BLE edema. Pulmonary/Chest: Effort normal and breath sounds normal. No respiratory distress. Abdominal: Soft.  There is no tenderness. Psychiatric: Patient has a normal mood and affect. behavior is normal. Judgment and thought content normal.  Results for orders placed or performed in visit on 06/01/22  Lipid panel  Result Value Ref Range   Cholesterol 164 <200 mg/dL   HDL 44 (L) > OR = 50 mg/dL   Triglycerides 75 <409 mg/dL   LDL Cholesterol (Calc) 104 (H) mg/dL (calc)   Total CHOL/HDL Ratio 3.7 <5.0 (calc)   Non-HDL Cholesterol (Calc) 120 <130 mg/dL (calc)  CBC with Differential/Platelet  Result Value Ref Range   WBC 4.6 3.8 - 10.8 Thousand/uL   RBC 4.37 3.80 - 5.10 Million/uL   Hemoglobin 11.2 (L) 11.7 - 15.5 g/dL   HCT 81.1 (L) 91.4 - 78.2 %   MCV 79.9 (L) 80.0 - 100.0 fL   MCH 25.6 (L) 27.0 - 33.0 pg   MCHC 32.1 32.0 - 36.0 g/dL   RDW 95.6 21.3 - 08.6 %   Platelets 319 140 - 400 Thousand/uL   MPV 10.0 7.5 - 12.5 fL   Neutro Abs 2,042 1,500 - 7,800 cells/uL   Lymphs Abs 2,070 850 - 3,900 cells/uL   Absolute Monocytes 354 200 - 950 cells/uL   Eosinophils Absolute 101 15 - 500 cells/uL   Basophils Absolute 32 0 - 200  cells/uL   Neutrophils Relative % 44.4 %   Total Lymphocyte 45.0 %   Monocytes  Relative 7.7 %   Eosinophils Relative 2.2 %   Basophils Relative 0.7 %   Smear Review    COMPLETE METABOLIC PANEL WITH GFR  Result Value Ref Range   Glucose, Bld 86 65 - 99 mg/dL   BUN 9 7 - 25 mg/dL   Creat 1.61 0.96 - 0.45 mg/dL   eGFR 87 > OR = 60 WU/JWJ/1.91Y7   BUN/Creatinine Ratio SEE NOTE: 6 - 22 (calc)   Sodium 142 135 - 146 mmol/L   Potassium 4.3 3.5 - 5.3 mmol/L   Chloride 105 98 - 110 mmol/L   CO2 31 20 - 32 mmol/L   Calcium 9.5 8.6 - 10.4 mg/dL   Total Protein 6.9 6.1 - 8.1 g/dL   Albumin 4.3 3.6 - 5.1 g/dL   Globulin 2.6 1.9 - 3.7 g/dL (calc)   AG Ratio 1.7 1.0 - 2.5 (calc)   Total Bilirubin 0.4 0.2 - 1.2 mg/dL   Alkaline phosphatase (APISO) 69 37 - 153 U/L   AST 20 10 - 35 U/L   ALT 22 6 - 29 U/L  Hemoglobin A1c  Result Value Ref Range   Hgb A1c MFr Bld 5.6 <5.7 % of total Hgb   Mean Plasma Glucose 114 mg/dL   eAG (mmol/L) 6.3 mmol/L  Urinalysis, Complete  Result Value Ref Range   Color, Urine YELLOW YELLOW   APPearance CLEAR CLEAR   Specific Gravity, Urine 1.023 1.001 - 1.035   pH 8.0 5.0 - 8.0   Glucose, UA NEGATIVE NEGATIVE   Bilirubin Urine NEGATIVE NEGATIVE   Ketones, ur NEGATIVE NEGATIVE   Hgb urine dipstick TRACE (A) NEGATIVE   Protein, ur NEGATIVE NEGATIVE   Nitrite NEGATIVE NEGATIVE   Leukocytes,Ua NEGATIVE NEGATIVE   WBC, UA 0-5 0 - 5 /HPF   RBC / HPF 0-2 0 - 2 /HPF   Squamous Epithelial / HPF 0-5 < OR = 5 /HPF   Bacteria, UA NONE SEEN NONE SEEN /HPF   Hyaline Cast NONE SEEN NONE SEEN /LPF   Note    VITAMIN D 25 Hydroxy (Vit-D Deficiency, Fractures)  Result Value Ref Range   Vit D, 25-Hydroxy 32 30 - 100 ng/mL  Cytology - PAP  Result Value Ref Range   High risk HPV Negative    Adequacy      Satisfactory for evaluation; transformation zone component PRESENT.   Diagnosis      - Negative for Intraepithelial Lesions or Malignancy (NILM)   Diagnosis - Benign  reactive/reparative changes    Comment Normal Reference Range HPV - Negative       Assessment & Plan:   Problem List Items Addressed This Visit   None    Follow up plan: No follow-ups on file.

## 2023-07-03 ENCOUNTER — Ambulatory Visit (INDEPENDENT_AMBULATORY_CARE_PROVIDER_SITE_OTHER): Payer: 59 | Admitting: Physician Assistant

## 2023-07-03 ENCOUNTER — Other Ambulatory Visit (HOSPITAL_COMMUNITY)
Admission: RE | Admit: 2023-07-03 | Discharge: 2023-07-03 | Disposition: A | Discharge status: Home / Self Care | Payer: 59 | Source: Ambulatory Visit | Attending: Nurse Practitioner | Admitting: Nurse Practitioner

## 2023-07-03 VITALS — BP 122/76 | HR 105 | Temp 98.1°F | Resp 16 | Ht 63.0 in | Wt 171.1 lb

## 2023-07-03 DIAGNOSIS — R3915 Urgency of urination: Secondary | ICD-10-CM | POA: Diagnosis not present

## 2023-07-03 DIAGNOSIS — N390 Urinary tract infection, site not specified: Secondary | ICD-10-CM | POA: Diagnosis not present

## 2023-07-03 DIAGNOSIS — B3731 Acute candidiasis of vulva and vagina: Secondary | ICD-10-CM | POA: Insufficient documentation

## 2023-07-03 DIAGNOSIS — R319 Hematuria, unspecified: Secondary | ICD-10-CM | POA: Diagnosis not present

## 2023-07-03 DIAGNOSIS — H539 Unspecified visual disturbance: Secondary | ICD-10-CM | POA: Diagnosis not present

## 2023-07-03 DIAGNOSIS — R519 Headache, unspecified: Secondary | ICD-10-CM | POA: Diagnosis not present

## 2023-07-03 LAB — POCT URINALYSIS DIPSTICK
Bilirubin, UA: NEGATIVE
Glucose, UA: NEGATIVE
Ketones, UA: NEGATIVE
Nitrite, UA: NEGATIVE
Protein, UA: NEGATIVE
Spec Grav, UA: 1.01 (ref 1.010–1.025)
Urobilinogen, UA: 0.2 U/dL
pH, UA: 7 (ref 5.0–8.0)

## 2023-07-03 NOTE — Progress Notes (Unsigned)
BP 122/76   Pulse (!) 105   Temp 98.1 F (36.7 C) (Oral)   Resp 16   Ht 5\' 3"  (1.6 m)   Wt 171 lb 1.6 oz (77.6 kg)   LMP 12/19/2015   SpO2 97%   BMI 30.31 kg/m    Subjective:    [name], female    DOB: 03-May-1971, 52 y.o.   MRN: [id number]  Today's Provider: Jacquelin Hawking, MHS, PA-C Introduced myself to the patient as a PA-C and provided education on APPs in clinical practice.    HPI: Desiree [name] is a 52 y.o. female  Chief Complaint  Patient presents with   Stress    Mom has parkinson and Alzheimer   Headache    Could be related to stress   urine cloudy    No UTI sx   URINARY SYMPTOMS  She reports having cloudy urine since June 2024, especially worse in the AM   Dysuria: no Urinary frequency: no Urgency: yes Small volume voids: no Urinary incontinence: yes- started after giving birth to her son, ongoing for several years but seems to be getting worse  Foul odor: no Hematuria: no Abdominal pain: yes Back pain: no Suprapubic pain/pressure: yes Flank pain: yes- started this AM  Fever:  no Vomiting: no Relief with cranberry juice: has not tried this  Relief with pyridium: Has not taken anything   Previous urinary tract infection: yes Recurrent urinary tract infection: no  History of sexually transmitted disease: she denies concerns for STD exposure  Treatments attempted: increasing fluids     She reports previous hx of migraines  She states in the past 2 weeks she has had 3 headaches States the 2nd one lasted for about 5 days and seemed to be a rebound from the first one She reports current headache that started today She states current headache seems different than her typical migraine- location is different  She reports vision changes that started about a month ago - states glasses are not helping She reports headache is in the top of her head  She states current headache does not hurt worse than her typical migraine She  has tried extra strength tylenol and herbal remedy for pain management - these have provided mild relief    Relevant past medical, surgical, family and social history reviewed and updated as indicated. Interim medical history since our last visit reviewed. Allergies and medications reviewed and updated.  Review of Systems  Constitutional:  Negative for chills and fever.  Eyes:  Positive for visual disturbance.  Gastrointestinal:  Positive for abdominal pain.  Genitourinary:  Positive for flank pain and urgency. Negative for decreased urine volume, dysuria, frequency, hematuria and pelvic pain.  Neurological:  Positive for numbness (right hand and arm) and headaches. Negative for dizziness, syncope, facial asymmetry, speech difficulty and weakness.       Objective:    BP 122/76   Pulse (!) 105   Temp 98.1 F (36.7 C) (Oral)   Resp 16   Ht 5\' 3"  (1.6 m)   Wt 171 lb 1.6 oz (77.6 kg)   LMP 12/19/2015   SpO2 97%   BMI 30.31 kg/m   Wt Readings from Last 3 Encounters:  07/03/23 171 lb 1.6 oz (77.6 kg)  08/24/22 166 lb (75.3 kg)  06/01/22 173 lb (78.5 kg)    Physical Exam Vitals reviewed.  Constitutional:      General: She is awake.     Appearance: Normal appearance.  She is well-developed and well-groomed.  HENT:     Head: Normocephalic and atraumatic.  Cardiovascular:     Rate and Rhythm: Normal rate and regular rhythm.     Pulses:          Radial pulses are 2+ on the right side and 2+ on the left side.     Heart sounds: Normal heart sounds. No murmur heard.    No friction rub. No gallop.  Pulmonary:     Effort: Pulmonary effort is normal.     Breath sounds: Normal breath sounds. No decreased air movement. No decreased breath sounds, wheezing, rhonchi or rales.  Musculoskeletal:     Cervical back: Normal range of motion and neck supple.     Right lower leg: No edema.     Left lower leg: No edema.  Neurological:     General: No focal deficit present.     Mental Status:  She is alert and oriented to person, place, and time.     GCS: GCS eye subscore is 4. GCS verbal subscore is 5. GCS motor subscore is 6.     Cranial Nerves: No cranial nerve deficit, dysarthria or facial asymmetry.     Motor: No weakness, tremor, atrophy or abnormal muscle tone.     Gait: Gait is intact.     Comments: Comments: MENTAL STATUS: AAOx3, memory intact, fund of knowledge appropriate   LANG/SPEECH: Naming and repetition intact, fluent, no dysarthria, follows 3-step commands, answers questions appropriately   CRANIAL NERVES:   II: Pupils equal and reactive, no RAPD   III, IV, VI: EOM intact, no gaze preference or deviation, no nystagmus.   V: normal sensation in V1, V2, and V3 segments bilaterally   VII: no asymmetry, no nasolabial fold flattening   VIII: normal hearing to speech   IX, X: normal palatal elevation, no uvular deviation   XI: 5/5 head turn and 5/5 shoulder shrug bilaterally   XII: midline tongue protrusion   MOTOR:  5/5 bilateral grip strength 5/5 strength dorsiflexion/plantarflexion b/l   COORD: Normal  heel to shin, no tremor, no dysmetria   STATION: normal stance, no truncal ataxia   GAIT: Normal   Psychiatric:        Attention and Perception: Attention and perception normal.        Mood and Affect: Mood and affect normal.        Speech: Speech normal.        Behavior: Behavior normal. Behavior is cooperative.          Results for orders placed or performed in visit on 07/03/23  POCT urinalysis dipstick  Result Value Ref Range   Color, UA Yellow    Clarity, UA Cloudy    Glucose, UA Negative Negative   Bilirubin, UA Negative    Ketones, UA Negative    Spec Grav, UA 1.010 1.010 - 1.025   Blood, UA LArge    pH, UA 7.0 5.0 - 8.0   Protein, UA Negative Negative   Urobilinogen, UA 0.2 0.2 or 1.0 E.U./dL   Nitrite, UA Negative    Leukocytes, UA Moderate (2+) (A) Negative   Appearance Hazy    Odor foul   Cervicovaginal ancillary only   Result Value Ref Range   Trichomonas Negative    Bacterial Vaginitis (gardnerella) Negative    Candida Vaginitis Negative    Candida Glabrata Positive (A)    Comment      Normal Reference Range Bacterial Vaginosis - Negative  Comment Normal Reference Range Candida Species - Negative    Comment Normal Reference Range Candida Galbrata - Negative    Comment Normal Reference Range Trichomonas - Negative       Assessment & Plan:   Problem List Items Addressed This Visit       Other   Headache on top of head - Primary    Acute, new concern She reports history of migraines-unable to confirm history via chart review She reports over the last 2 weeks she has had 3 headaches.  She states that the second 1 lasted for about 5 days and seem to be a rebound from the first headache.  She reports current headache in office today which seems different from her typical migraines She reports vision changes for the past month despite using glasses and reports new headache location at the top of her head. She has tried extra strength Tylenol intermittently as well as an herbal remedy that have provided some relief Given persistent headaches, change in headache nature and severity, vision changes I recommend getting a head CT scan for rule out Results of imaging to dictate further management For now recommend treating headaches with Tylenol, ibuprofen as needed Recommend follow-up in about 4 weeks or sooner if concerns arise      Relevant Orders   CT HEAD WO CONTRAST ( )   Vision changes   Relevant Orders   CT HEAD WO CONTRAST ( )   Other Visit Diagnoses     Urinary urgency       Relevant Orders   Cervicovaginal ancillary only (Completed)   POCT urinalysis dipstick (Completed)   Urine Culture   Hematuria, unspecified type       Relevant Orders   POCT urinalysis dipstick (Completed)   Urine Culture   Vulvovaginal candidiasis     Acute, new concern Patient reports cloudy urine for the  past several months but denies dysuria, increased frequency. Cervicovaginal swab was positive for yeast so we will start Diflucan x 2 doses Urinalysis was concerning for leukocytes as well as RBCs so we will send for culture for rule out Culture results to dictate further management Follow-up as needed for progressing or persistent symptoms    Relevant Medications   fluconazole (DIFLUCAN) 150 MG tablet        Follow up plan: Return in about 4 weeks (around 07/31/2023) for headaches, vision changes .   Jacquelin Hawking, MHS, PA-C [facility]

## 2023-07-04 DIAGNOSIS — H539 Unspecified visual disturbance: Secondary | ICD-10-CM | POA: Insufficient documentation

## 2023-07-04 DIAGNOSIS — R519 Headache, unspecified: Secondary | ICD-10-CM | POA: Insufficient documentation

## 2023-07-04 LAB — CERVICOVAGINAL ANCILLARY ONLY
Bacterial Vaginitis (gardnerella): NEGATIVE
Candida Glabrata: POSITIVE — AB
Candida Vaginitis: NEGATIVE
Comment: NEGATIVE
Comment: NEGATIVE
Comment: NEGATIVE
Comment: NEGATIVE
Trichomonas: NEGATIVE

## 2023-07-04 MED ORDER — FLUCONAZOLE 150 MG PO TABS: 150.0000 mg | ORAL_TABLET | ORAL | 0 refills | Status: DC | PRN

## 2023-07-04 NOTE — Assessment & Plan Note (Signed)
Acute, new concern She reports history of migraines-unable to confirm history via chart review She reports over the last 2 weeks she has had 3 headaches.  She states that the second 1 lasted for about 5 days and seem to be a rebound from the first headache.  She reports current headache in office today which seems different from her typical migraines She reports vision changes for the past month despite using glasses and reports new headache location at the top of her head. She has tried extra strength Tylenol intermittently as well as an herbal remedy that have provided some relief Given persistent headaches, change in headache nature and severity, vision changes I recommend getting a head CT scan for rule out Results of imaging to dictate further management For now recommend treating headaches with Tylenol, ibuprofen as needed Recommend follow-up in about 4 weeks or sooner if concerns arise

## 2023-07-04 NOTE — Progress Notes (Signed)
Your cervicovaginal swab was positive for yeast.  I have sent in a prescription for Diflucan for you to take once every 72 hours as needed to provide relief Your urinalysis was positive for white blood cells as well as blood.  This can sometimes be consistent with a yeast infection as well as a UTI.  I have sent off a sample for a urine culture which should be back in the next few days.  If bacteria is present I will send you in an antibiotic to treat this.  We will keep you updated once those results are back

## 2023-07-05 LAB — URINE CULTURE
MICRO NUMBER:: 15568426
SPECIMEN QUALITY:: ADEQUATE

## 2023-07-05 MED ORDER — SULFAMETHOXAZOLE-TRIMETHOPRIM 800-160 MG PO TABS: 1.0000 | ORAL_TABLET | Freq: Two times a day (BID) | ORAL | 0 refills | Status: AC

## 2023-07-05 NOTE — Progress Notes (Signed)
Your urine culture results are back and it appears that you have a UTI caused by a bacteria called Klebsiella pneumoniae.  I have sent in an antibiotic for you to take in order to treat this.  This medication is called Bactrim and should be taken twice a day for 7 days.  Please finish the entire course unless you develop an allergic reaction or other side effects.  If this occurs please call the office so we can send you in a substitute Please make sure that you are staying well-hydrated and avoid holding her urine for prolonged periods of time.

## 2023-07-05 NOTE — Addendum Note (Signed)
Addended by: Jacquelin Hawking on: 07/05/2023 04:08 PM   Modules accepted: Orders

## 2023-07-06 ENCOUNTER — Ambulatory Visit
Admission: RE | Admit: 2023-07-06 | Discharge: 2023-07-06 | Disposition: A | Discharge status: Home / Self Care | Payer: 59 | Source: Ambulatory Visit | Attending: Physician Assistant | Admitting: Physician Assistant

## 2023-07-06 DIAGNOSIS — R519 Headache, unspecified: Secondary | ICD-10-CM | POA: Diagnosis present

## 2023-07-06 DIAGNOSIS — H539 Unspecified visual disturbance: Secondary | ICD-10-CM | POA: Insufficient documentation

## 2023-07-06 NOTE — Progress Notes (Signed)
Your head CT was overall normal.  It did not show signs of abnormality, mass or lesions.  There was a tiny area that could represent a cyst or calcification but radiology does not recommend further imaging workup at this time.

## 2023-07-10 DIAGNOSIS — H539 Unspecified visual disturbance: Secondary | ICD-10-CM

## 2023-07-10 DIAGNOSIS — R519 Headache, unspecified: Secondary | ICD-10-CM

## 2023-08-10 ENCOUNTER — Encounter: Payer: Self-pay | Admitting: Physician Assistant
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
## 2023-08-14 ENCOUNTER — Encounter: Payer: Self-pay | Admitting: Family Medicine

## 2023-08-14 ENCOUNTER — Ambulatory Visit (INDEPENDENT_AMBULATORY_CARE_PROVIDER_SITE_OTHER): Payer: 59 | Admitting: Family Medicine

## 2023-08-14 VITALS — BP 132/76 | HR 90 | Resp 16 | Ht 63.0 in | Wt 177.0 lb

## 2023-08-14 DIAGNOSIS — Z1231 Encounter for screening mammogram for malignant neoplasm of breast: Secondary | ICD-10-CM | POA: Diagnosis not present

## 2023-08-14 DIAGNOSIS — Z83719 Family history of colon polyps, unspecified: Secondary | ICD-10-CM | POA: Diagnosis not present

## 2023-08-14 DIAGNOSIS — Z0000 Encounter for general adult medical examination without abnormal findings: Secondary | ICD-10-CM | POA: Diagnosis not present

## 2023-08-17 DIAGNOSIS — M7661 Achilles tendinitis, right leg: Secondary | ICD-10-CM | POA: Diagnosis not present

## 2023-08-20 NOTE — Progress Notes (Unsigned)
Name: Desiree [name]   MRN: [id number]    DOB: Aug 04, 1971   Date:08/21/2023       Progress Note  Subjective  Chief Complaint  Follow Up  HPI  Discussed the use of AI scribe software for clinical note transcription with the patient, who gave verbal consent to proceed.  History of Present Illness   The patient, with a history of depression, hypothyroidism, anemia, dyslipidemia, sleep apnea, and prediabetes, presents for a follow-up visit. She reports a recurrence of depressive symptoms, including difficulty sleeping, increased anxiety, and difficulty focusing. The patient describes feeling nervous and anxious about work, feeling like she is not doing her job correctly, and having difficulty focusing. She also reports waking up frequently during the night, which may be related to her sleep apnea. The patient has been self-adjusting her thyroid medication dosage and reports excessive sweating, which could be a sign of hyperthyroidism. The patient also has a history of diverticulitis and has not followed up with a GI specialist as previously recommended.          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   Platelet dysfunction (HCC) 04/07/2011   Lupus anticoagulant positive 11/07/2010    Past Surgical History:  Procedure Laterality Date   ANAL FISSURE REPAIR  2008   COLONOSCOPY WITH PROPOFOL N/A 07/11/2018   Procedure: COLONOSCOPY WITH PROPOFOL;  Surgeon: Toney Reil, MD;  Location: [facility] ENDOSCOPY;  Service: Gastroenterology;  Laterality: N/A;   EXCISIONAL HEMORRHOIDECTOMY  2008    Bleeding complication   HERNIA REPAIR      Family History  Problem Relation Age of Onset   Stroke Mother        late 76s   Hypertension Mother    Lupus Cousin    Breast cancer Cousin 29   Cancer Father        Prostate   Healthy Brother    Hypertension Maternal Grandmother    Heart attack Maternal Grandmother        before age 83   Cancer Maternal Grandfather        Throat   Hypertension Paternal Grandmother    Cancer Paternal Grandmother        Jaw   Stroke Paternal Grandfather        before age 1   Hypertension Brother    Healthy Son    Breast cancer Cousin 40    Social History   Tobacco Use   Smoking status: Never   Smokeless tobacco: Never  Substance Use Topics   Alcohol use: No     Current Outpatient Medications:    Cholecalciferol (VITAMIN D) 50 MCG (2000 UT) CAPS, Take 1 capsule by mouth daily., Disp: , Rfl:    liothyronine (CYTOMEL) 5 MCG tablet, Take 5-10 mcg by mouth every morning., Disp: , Rfl:    Misc Natural Products (CORTISOL PO), Take 1 tablet by mouth daily., Disp: , Rfl:    escitalopram (LEXAPRO) 10 MG tablet, Take 1 tablet (10 mg total) by mouth daily., Disp: 90 tablet, Rfl: 0  Allergies  Allergen Reactions   Biotin Hives and Palpitations  Nylon Itching   Cheese Other (See Comments) and Rash    I personally reviewed active problem list, medication list, allergies, family history, social history, health maintenance with the patient/caregiver today.   ROS  Ten systems reviewed and is negative except as mentioned in HPI    Objective  Vitals:   08/21/23 1313  BP: 130/78  Pulse: 96  Resp: 16  SpO2: 98%  Weight: 177 lb (80.3 kg)  Height: 5\' 3"  (1.6 m)    Body mass index is 31.35 kg/m.  Physical Exam  Constitutional: Patient appears well-developed and well-nourished. Obese  No distress.  HEENT: head atraumatic, normocephalic, pupils equal and reactive to light, neck supple Cardiovascular: Normal rate, regular rhythm and normal  heart sounds.  No murmur heard. No BLE edema. Pulmonary/Chest: Effort normal and breath sounds normal. No respiratory distress. Abdominal: Soft.  There is no tenderness. Psychiatric: Patient has a normal mood and affect. behavior is normal. Judgment and thought content normal.    PHQ2/9:    08/21/2023    1:18 PM 08/14/2023    3:08 PM 07/03/2023   11:37 AM 08/24/2022   10:18 AM 06/01/2022   11:36 AM  Depression screen PHQ 2/9  Decreased Interest 2 3 0 2 2  Down, Depressed, Hopeless 2 2 0 2 1  PHQ - 2 Score 4 5 0 4 3  Altered sleeping 3 3 0 2 2  Tired, decreased energy 3 0 0 1 2  Change in appetite 0 0 0 1 0  Feeling bad or failure about yourself  2 2 0 3 2  Trouble concentrating 3 3 0 2 2  Moving slowly or fidgety/restless 0 0 0 1 0  Suicidal thoughts 0 0 0 0 0  PHQ-9 Score 15 13 0 14 11  Difficult doing work/chores   Not difficult at all Very difficult     phq 9 is positive     08/21/2023    1:37 PM 08/24/2022   10:27 AM 11/25/2018   12:08 PM  GAD 7 : Generalized Anxiety Score  Nervous, Anxious, on Edge 2 1 3   Control/stop worrying 2 1 0  Worry too much - different things 2 3 0  Trouble relaxing 3 3 3   Restless 2 1 0  Easily annoyed or irritable 2 3 3   Afraid - awful might happen 2 1 0  Total GAD 7 Score 15 13 9   Anxiety Difficulty Very difficult Very difficult Somewhat difficult     Fall Risk:    08/21/2023    1:09 PM 08/14/2023    3:08 PM 07/03/2023   11:37 AM 08/24/2022   10:18 AM 06/01/2022   11:23 AM  Fall Risk   Falls in the past year? 0 0 0 1 1  Number falls in past yr: 0 0 0 1 0  Injury with Fall? 0 0 0 1 1  Risk for fall due to : No Fall Risks No Fall Risks No Fall Risks Impaired balance/gait No Fall Risks  Follow up Falls prevention discussed Falls prevention discussed Falls prevention discussed;Education provided;Falls evaluation completed Falls prevention discussed;Education provided;Falls evaluation completed Falls prevention discussed       Functional Status Survey: Is the patient deaf or have difficulty hearing?: No Does the patient have difficulty seeing, even when wearing glasses/contacts?: Yes Does the patient have difficulty concentrating, remembering, or making decisions?: Yes Does the patient have difficulty walking or climbing stairs?: No Does the patient have difficulty dressing or bathing?: No Does the patient  have difficulty doing errands alone such as visiting a doctor's office or shopping?: No    Assessment & Plan  Assessment and Plan    Major Depressive Disorder: Recurrent depressive symptoms including anxiety, difficulty focusing, and sleep disturbances. Previously responded well to Lexapro. -Resume Lexapro 10mg  daily, starting with half a tablet for a few days before increasing to a full tablet.  Obstructive Sleep Apnea: Non-compliance with CPAP due to headaches. Unclear if headaches are related to CPAP use or pressure settings. -Attempt to resume CPAP use and monitor for resolution of headaches. -Follow up with pulmonologist on 09/21/2023.  Hyperlipidemia: Elevated LDL and low HDL from previous labs. -Repeat lipid panel.  Anemia of Chronic Disease: Stable mild anemia with prolonged PTT and platelet dysfunction. No recent bleeding or bruising. -Continue monitoring with CBC.  Hypothyroidism: Currently on Cytomel (liothyronine) 20mg , possibly too high a dose. -Check TSH and adjust Cytomel dose as needed.  Prediabetes: Previous elevated A1C, currently asymptomatic. -Check A1C.  Vitamin D Deficiency: Previously low, currently normalized on supplementation. -Continue Vitamin D 2000 units daily.  Diverticulitis: History of two flares in 2023, no recent follow-up with GI. -Recommend follow-up with GI.  General Health Maintenance -Declined flu shot.          Major Depressive Disorder: Recurrent depressive symptoms including anxiety, difficulty focusing, and sleep disturbances. Previously  responded well to Lexapro. -Resume Lexapro 10mg  daily, starting with half a tablet for a few days before increasing to a full tablet.  Obstructive Sleep Apnea: Non-compliance with CPAP due to headaches. Unclear if headaches are related to CPAP use or pressure settings. -Attempt to resume CPAP use and monitor for resolution of headaches. -Follow up with pulmonologist on 09/21/2023.  Hyperlipidemia: Elevated LDL and low HDL from previous labs. -Repeat lipid panel.  Anemia of Chronic Disease: Stable mild anemia with prolonged PTT and platelet dysfunction. No recent bleeding or bruising. -Continue monitoring with CBC.  Hypothyroidism: Currently on Cytomel (liothyronine) 20mg , possibly too high a dose. -Check TSH and adjust Cytomel dose as needed.  Prediabetes: Previous elevated A1C, currently asymptomatic. -Check A1C.  Vitamin D Deficiency: Previously low, currently normalized on supplementation. -Continue Vitamin D 2000 units daily.  Diverticulitis: History of two flares in 2023, no recent follow-up with GI. -Recommend follow-up with GI.  General Health Maintenance -Declined flu shot.

## 2023-08-21 ENCOUNTER — Encounter: Payer: Self-pay | Admitting: Family Medicine

## 2023-08-21 ENCOUNTER — Ambulatory Visit (INDEPENDENT_AMBULATORY_CARE_PROVIDER_SITE_OTHER): Payer: 59 | Admitting: Family Medicine

## 2023-08-21 VITALS — BP 130/78 | HR 96 | Resp 16 | Ht 63.0 in | Wt 177.0 lb

## 2023-08-21 DIAGNOSIS — E785 Hyperlipidemia, unspecified: Secondary | ICD-10-CM | POA: Diagnosis not present

## 2023-08-21 DIAGNOSIS — Z8719 Personal history of other diseases of the digestive system: Secondary | ICD-10-CM | POA: Diagnosis not present

## 2023-08-21 DIAGNOSIS — D691 Qualitative platelet defects: Secondary | ICD-10-CM

## 2023-08-21 DIAGNOSIS — D638 Anemia in other chronic diseases classified elsewhere: Secondary | ICD-10-CM | POA: Diagnosis not present

## 2023-08-21 DIAGNOSIS — G4733 Obstructive sleep apnea (adult) (pediatric): Secondary | ICD-10-CM | POA: Diagnosis not present

## 2023-08-21 DIAGNOSIS — G4700 Insomnia, unspecified: Secondary | ICD-10-CM | POA: Diagnosis not present

## 2023-08-21 DIAGNOSIS — E039 Hypothyroidism, unspecified: Secondary | ICD-10-CM | POA: Insufficient documentation

## 2023-08-21 DIAGNOSIS — E559 Vitamin D deficiency, unspecified: Secondary | ICD-10-CM

## 2023-08-21 DIAGNOSIS — R7303 Prediabetes: Secondary | ICD-10-CM | POA: Diagnosis not present

## 2023-08-21 DIAGNOSIS — F331 Major depressive disorder, recurrent, moderate: Secondary | ICD-10-CM | POA: Insufficient documentation

## 2023-08-21 MED ORDER — ESCITALOPRAM OXALATE 10 MG PO TABS: 10.0000 mg | ORAL_TABLET | Freq: Every day | ORAL | 0 refills | Status: DC

## 2023-08-22 ENCOUNTER — Encounter: Payer: 59 | Admitting: Family Medicine

## 2023-08-22 LAB — COMPLETE METABOLIC PANEL WITH GFR
AG Ratio: 1.5 (calc) (ref 1.0–2.5)
ALT: 17 U/L (ref 6–29)
AST: 21 U/L (ref 10–35)
Albumin: 4.3 g/dL (ref 3.6–5.1)
Alkaline phosphatase (APISO): 77 U/L (ref 37–153)
BUN: 10 mg/dL (ref 7–25)
CO2: 31 mmol/L (ref 20–32)
Calcium: 9.4 mg/dL (ref 8.6–10.4)
Chloride: 104 mmol/L (ref 98–110)
Creat: 0.93 mg/dL (ref 0.50–1.03)
Globulin: 2.8 g/dL (ref 1.9–3.7)
Glucose, Bld: 79 mg/dL (ref 65–99)
Potassium: 3.8 mmol/L (ref 3.5–5.3)
Sodium: 142 mmol/L (ref 135–146)
Total Bilirubin: 0.4 mg/dL (ref 0.2–1.2)
Total Protein: 7.1 g/dL (ref 6.1–8.1)
eGFR: 74 mL/min/{1.73_m2} (ref >=60)

## 2023-08-22 LAB — CBC WITH DIFFERENTIAL/PLATELET
Absolute Lymphocytes: 1717 {cells}/uL (ref 850–3900)
Absolute Monocytes: 421 {cells}/uL (ref 200–950)
Basophils Absolute: 22 {cells}/uL (ref 0–200)
Basophils Relative: 0.4 %
Eosinophils Absolute: 49 {cells}/uL (ref 15–500)
Eosinophils Relative: 0.9 %
HCT: 37.1 % (ref 35.0–45.0)
Hemoglobin: 11.7 g/dL (ref 11.7–15.5)
MCH: 25.7 pg — ABNORMAL LOW (ref 27.0–33.0)
MCHC: 31.5 g/dL — ABNORMAL LOW (ref 32.0–36.0)
MCV: 81.4 fL (ref 80.0–100.0)
MPV: 10.3 fL (ref 7.5–12.5)
Monocytes Relative: 7.8 %
Neutro Abs: 3191 {cells}/uL (ref 1500–7800)
Neutrophils Relative %: 59.1 %
Platelets: 352 10*3/uL (ref 140–400)
RBC: 4.56 10*6/uL (ref 3.80–5.10)
RDW: 12.8 % (ref 11.0–15.0)
Total Lymphocyte: 31.8 %
WBC: 5.4 10*3/uL (ref 3.8–10.8)

## 2023-08-22 LAB — LIPID PANEL
Cholesterol: 185 mg/dL (ref <200)
HDL: 57 mg/dL (ref >=50)
LDL Cholesterol (Calc): 105 mg/dL — ABNORMAL HIGH
Non-HDL Cholesterol (Calc): 128 mg/dL (ref <130)
Total CHOL/HDL Ratio: 3.2 (calc) (ref <5.0)
Triglycerides: 132 mg/dL (ref <150)

## 2023-08-22 LAB — HEMOGLOBIN A1C
Hgb A1c MFr Bld: 6 %{Hb} — ABNORMAL HIGH (ref <5.7)
Mean Plasma Glucose: 126 mg/dL
eAG (mmol/L): 7 mmol/L

## 2023-08-22 LAB — TSH: TSH: 0.79 m[IU]/L

## 2023-09-21 ENCOUNTER — Ambulatory Visit: Payer: 59 | Admitting: Pulmonary Disease

## 2023-09-21 ENCOUNTER — Encounter: Payer: Self-pay | Admitting: Pulmonary Disease

## 2023-09-21 ENCOUNTER — Ambulatory Visit: Payer: Self-pay | Admitting: *Deleted

## 2023-09-21 ENCOUNTER — Ambulatory Visit: Payer: 59

## 2023-09-21 VITALS — BP 137/92 | HR 78 | Temp 98.1°F | Ht 63.0 in | Wt 175.8 lb

## 2023-09-21 DIAGNOSIS — G4733 Obstructive sleep apnea (adult) (pediatric): Secondary | ICD-10-CM | POA: Diagnosis not present

## 2023-09-21 DIAGNOSIS — R052 Subacute cough: Secondary | ICD-10-CM

## 2023-09-21 DIAGNOSIS — J0110 Acute frontal sinusitis, unspecified: Secondary | ICD-10-CM

## 2023-09-21 MED ORDER — FLUTICASONE PROPIONATE 50 MCG/ACT NA SUSP: 1.0000 | Freq: Every day | NASAL | 2 refills | Status: AC

## 2023-09-21 MED ORDER — PREDNISONE 10 MG PO TABS: ORAL_TABLET | ORAL | 0 refills | Status: DC
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
## 2023-09-21 NOTE — Telephone Encounter (Signed)
  Chief Complaint: left side jaw locked approx 5 minutes.  Symptoms: left jaw not locked at this time. Pain left side level 4 now. Reports when eating jaw "crackling" sound noted while chewing. Headaches 2-4 in a month. Left eye shooting pain lasting few seconds happening random times. 2 "dots" noted on left wrist like a rash. Frequency: today  Pertinent Negatives: Patient denies fever no other sx reported Disposition: [x] ED /[] Urgent Care (no appt availability in office) / [] Appointment(In office/virtual)/ []  [facility]/ [] Home Care/ [] Refused Recommended Disposition /[] [facility]/ []  Follow-up with PCP Additional Notes:   Recommended ED no available appt today . Patient agreed to go to ED . Appt scheduled for 09/24/23. Please advise.      Reason for Disposition  Patient sounds very sick or weak to the triager  Answer Assessment - Initial Assessment Questions 1. ONSET: "When did the mouth start hurting?" (e.g., hours or days ago)      Today , left jaw area  2. SEVERITY: "How bad is the pain?" (Scale 1-10; mild, moderate or severe)   - MILD (1-3):  doesn't interfere with eating or normal activities   - MODERATE (4-7): interferes with eating some solids and normal activities   - SEVERE (8-10):  excruciating pain, interferes with most normal activities   - SEVERE DYSPHAGIA: can't swallow liquids, drooling     Pain level 4 now . Jaw not locked now  3. SORES: "Are there any sores or ulcers in the mouth?" If Yes, ask: "What part of the mouth are the sores in?"     na 4. FEVER: "Do you have a fever?" If Yes, ask: "What is your temperature, how was it measured, and when did it start?"     Na  5. CAUSE: "What do you think is causing the mouth pain?"     Not sure  6. OTHER SYMPTOMS: "Do you have any other symptoms?" (e.g., difficulty breathing)     Headaches 2- 4 in a month, pain left jaw and "locked" today approx 5 minutes,shooting pain in left eye lasting few seconds.  Random times for the past month. 2 "dots" on wrist like a rash left wrist.  Protocols used: Mouth Pain-A-AH

## 2023-09-21 NOTE — Patient Instructions (Addendum)
We will place an order for new mask and supplies for your CPAP machine  Start prednisone taper for sinus congestion and chest congestion  We will check a chest x-ray today for cough and chest congestion  Resume flonase nasal spray, 1 spray daily per nostril  Resume CPAP therapy nightly, need to use the machine for 4 hours or more each night to continue to have insurance coverage.  Follow up in 2 months for compliance report review, can be virtual visit if more convenient

## 2023-09-21 NOTE — Telephone Encounter (Signed)
Pt going to ED.  

## 2023-09-24 ENCOUNTER — Other Ambulatory Visit: Payer: Self-pay

## 2023-09-24 ENCOUNTER — Ambulatory Visit (INDEPENDENT_AMBULATORY_CARE_PROVIDER_SITE_OTHER): Payer: 59 | Admitting: Nurse Practitioner

## 2023-09-24 ENCOUNTER — Encounter: Payer: Self-pay | Admitting: Nurse Practitioner

## 2023-09-24 VITALS — BP 122/84 | HR 83 | Temp 98.2°F | Ht 63.0 in | Wt 177.3 lb

## 2023-09-24 DIAGNOSIS — M26609 Unspecified temporomandibular joint disorder, unspecified side: Secondary | ICD-10-CM | POA: Diagnosis not present

## 2023-09-24 MED ORDER — METHOCARBAMOL 500 MG PO TABS: 500.0000 mg | ORAL_TABLET | Freq: Two times a day (BID) | ORAL | 0 refills | Status: DC | PRN

## 2023-09-24 MED ORDER — NAPROXEN 500 MG PO TABS: 500.0000 mg | ORAL_TABLET | Freq: Two times a day (BID) | ORAL | 0 refills | Status: AC

## 2023-09-24 NOTE — Progress Notes (Signed)
BP 122/84   Pulse 83   Temp 98.2 F (36.8 C) (Oral)   Ht 5\' 3"  (1.6 m)   Wt 177 lb 4.8 oz (80.4 kg)   LMP 12/19/2015   BMI 31.41 kg/m    Subjective:    [name], female    DOB: 1971/07/17, 52 y.o.   MRN: [id number]  HPI: Desiree [name] is a 52 y.o. female  Chief Complaint  Patient presents with   Jaw Pain    Left side painful   Eye Pain    Left eye   Rash    Left hand    Discussed the use of AI scribe software for clinical note transcription with the patient, who gave verbal consent to proceed.  History of Present Illness   The patient presents with a chief complaint of jaw locking and pain, which has been ongoing for several months. The pain is localized to the left side and has been progressively worsening. They describe the pain as shooting from the back of their eye. They also report the recent appearance of two small bumps on their left wrist, which do not itch and are not associated with any known insect bites.  The patient has a history of stress, during which they may clench their jaw, potentially contributing to their current symptoms. They have not previously been diagnosed with temporomandibular joint disorder (TMJ). They have tried using a mouth guard for snoring, but found it uncomfortable.       08/21/2023    1:18 PM 08/14/2023    3:08 PM 07/03/2023   11:37 AM  Depression screen PHQ 2/9  Decreased Interest 2 3 0  Down, Depressed, Hopeless 2 2 0  PHQ - 2 Score 4 5 0  Altered sleeping 3 3 0  Tired, decreased energy 3 0 0  Change in appetite 0 0 0  Feeling bad or failure about yourself  2 2 0  Trouble concentrating 3 3 0  Moving slowly or fidgety/restless 0 0 0  Suicidal thoughts 0 0 0  PHQ-9 Score 15 13 0  Difficult doing work/chores   Not difficult at all    Relevant past medical, surgical, family and social history reviewed and updated as indicated. Interim medical history since our last visit reviewed. Allergies and  medications reviewed and updated.  Review of Systems  Ten systems reviewed and is negative except as mentioned in HPI      Objective:    BP 122/84   Pulse 83   Temp 98.2 F (36.8 C) (Oral)   Ht 5\' 3"  (1.6 m)   Wt 177 lb 4.8 oz (80.4 kg)   LMP 12/19/2015   BMI 31.41 kg/m    Wt Readings from Last 3 Encounters:  09/24/23 177 lb 4.8 oz (80.4 kg)  09/21/23 175 lb 12.8 oz (79.7 kg)  08/21/23 177 lb (80.3 kg)    Physical Exam  Constitutional: Patient appears well-developed and well-nourished. Obese  No distress.  HEENT: head atraumatic, normocephalic, pupils equal and reactive to light, neck supple, jaw popping on left side Cardiovascular: Normal rate, regular rhythm and normal heart sounds.  No murmur heard. No BLE edema. Pulmonary/Chest: Effort normal and breath sounds normal. No respiratory distress. Abdominal: Soft.  There is no tenderness. Psychiatric: Patient has a normal mood and affect. behavior is normal. Judgment and thought content normal.   Results for orders placed or performed in visit on 08/21/23  Lipid panel   Collection Time: 08/21/23  1:51 PM  Result Value Ref Range   Cholesterol 185 <200 mg/dL   HDL 57 > OR = 50 mg/dL   Triglycerides 657 <846 mg/dL   LDL Cholesterol (Calc) 105 (H) mg/dL (calc)   Total CHOL/HDL Ratio 3.2 <5.0 (calc)   Non-HDL Cholesterol (Calc) 128 <130 mg/dL (calc)  CBC with Differential/Platelet   Collection Time: 08/21/23  1:51 PM  Result Value Ref Range   WBC 5.4 3.8 - 10.8 Thousand/uL   RBC 4.56 3.80 - 5.10 Million/uL   Hemoglobin 11.7 11.7 - 15.5 g/dL   HCT 96.2 95.2 - 84.1 %   MCV 81.4 80.0 - 100.0 fL   MCH 25.7 (L) 27.0 - 33.0 pg   MCHC 31.5 (L) 32.0 - 36.0 g/dL   RDW 32.4 40.1 - 02.7 %   Platelets 352 140 - 400 Thousand/uL   MPV 10.3 7.5 - 12.5 fL   Neutro Abs 3,191 1,500 - 7,800 cells/uL   Absolute Lymphocytes 1,717 850 - 3,900 cells/uL   Absolute Monocytes 421 200 - 950 cells/uL   Eosinophils Absolute 49 15 - 500  cells/uL   Basophils Absolute 22 0 - 200 cells/uL   Neutrophils Relative % 59.1 %   Total Lymphocyte 31.8 %   Monocytes Relative 7.8 %   Eosinophils Relative 0.9 %   Basophils Relative 0.4 %  COMPLETE METABOLIC PANEL WITH GFR   Collection Time: 08/21/23  1:51 PM  Result Value Ref Range   Glucose, Bld 79 65 - 99 mg/dL   BUN 10 7 - 25 mg/dL   Creat 2.53 6.64 - 4.03 mg/dL   eGFR 74 > OR = 60 KV/QQV/9.56L8   BUN/Creatinine Ratio SEE NOTE: 6 - 22 (calc)   Sodium 142 135 - 146 mmol/L   Potassium 3.8 3.5 - 5.3 mmol/L   Chloride 104 98 - 110 mmol/L   CO2 31 20 - 32 mmol/L   Calcium 9.4 8.6 - 10.4 mg/dL   Total Protein 7.1 6.1 - 8.1 g/dL   Albumin 4.3 3.6 - 5.1 g/dL   Globulin 2.8 1.9 - 3.7 g/dL (calc)   AG Ratio 1.5 1.0 - 2.5 (calc)   Total Bilirubin 0.4 0.2 - 1.2 mg/dL   Alkaline phosphatase (APISO) 77 37 - 153 U/L   AST 21 10 - 35 U/L   ALT 17 6 - 29 U/L  Hemoglobin A1c   Collection Time: 08/21/23  1:51 PM  Result Value Ref Range   Hgb A1c MFr Bld 6.0 (H) <5.7 % of total Hgb   Mean Plasma Glucose 126 mg/dL   eAG (mmol/L) 7.0 mmol/L  TSH   Collection Time: 08/21/23  1:51 PM  Result Value Ref Range   TSH 0.79 mIU/L       Assessment & Plan:   Problem List Items Addressed This Visit   None Visit Diagnoses       TMJ (temporomandibular joint disorder)    -  Primary   Relevant Medications   naproxen (NAPROSYN) 500 MG tablet   methocarbamol (ROBAXIN) 500 MG tablet        Assessment and Plan    Temporomandibular Joint Dysfunction (TMJ) Unilateral jaw locking and pain, with referred pain to the eye. Possible stress-related clenching. No prior diagnosis of TMJ. -Advise softer diet while in pain. -Consider use of a mouth guard -Apply tiger balm externally for pain relief. -Prescribe muscle relaxer, with caution regarding potential drowsiness. -Apply ice packs multiple times a day for inflammation. -Prescribe Naproxen, an anti-inflammatory, to be taken  twice daily with  food. -Advise patient to send a MyChart message to update on condition. -Provide a list of dentists for potential consultation.  Skin Lesions Two new skin lesions, non-pruritic, on the left wrist. No known insect bites or other causes identified. -Observe for changes or symptoms.        Follow up plan: Return if symptoms worsen or fail to improve.

## 2023-11-07 ENCOUNTER — Ambulatory Visit (INDEPENDENT_AMBULATORY_CARE_PROVIDER_SITE_OTHER): Payer: 59 | Admitting: Family Medicine

## 2023-11-07 VITALS — BP 118/72 | HR 95 | Temp 97.5°F | Resp 16 | Ht 63.0 in | Wt 178.5 lb

## 2023-11-07 DIAGNOSIS — E039 Hypothyroidism, unspecified: Secondary | ICD-10-CM

## 2023-11-07 DIAGNOSIS — G43009 Migraine without aura, not intractable, without status migrainosus: Secondary | ICD-10-CM

## 2023-11-07 DIAGNOSIS — R94120 Abnormal auditory function study: Secondary | ICD-10-CM

## 2023-11-07 DIAGNOSIS — G4733 Obstructive sleep apnea (adult) (pediatric): Secondary | ICD-10-CM

## 2023-11-07 DIAGNOSIS — Z8719 Personal history of other diseases of the digestive system: Secondary | ICD-10-CM | POA: Diagnosis not present

## 2023-11-07 DIAGNOSIS — R7303 Prediabetes: Secondary | ICD-10-CM

## 2023-11-07 DIAGNOSIS — F331 Major depressive disorder, recurrent, moderate: Secondary | ICD-10-CM | POA: Diagnosis not present

## 2023-11-07 DIAGNOSIS — G4700 Insomnia, unspecified: Secondary | ICD-10-CM

## 2023-11-07 DIAGNOSIS — F419 Anxiety disorder, unspecified: Secondary | ICD-10-CM

## 2023-11-07 MED ORDER — DULOXETINE HCL 30 MG PO CPEP: 30.0000 mg | ORAL_CAPSULE | Freq: Every day | ORAL | 0 refills | Status: AC

## 2023-11-07 MED ORDER — NURTEC 75 MG PO TBDP: 1.0000 | ORAL_TABLET | Freq: Every day | ORAL | 1 refills | Status: AC | PRN

## 2023-11-07 MED ORDER — DEXCOM G7 SENSOR MISC: 1.0000 | 1 refills | Status: AC

## 2023-11-07 MED ORDER — TRAZODONE HCL 50 MG PO TABS: 25.0000 mg | ORAL_TABLET | Freq: Every evening | ORAL | 0 refills | Status: AC | PRN

## 2023-11-07 NOTE — Progress Notes (Signed)
Name: Desiree [name]   MRN: [id number]    DOB: 06-02-1971   Date:11/07/2023       Progress Note  Subjective  Chief Complaint  Chief Complaint  Patient presents with   Form Completion   Discussed the use of AI scribe software for clinical note transcription with the patient, who gave verbal consent to proceed.  History of Present Illness   Desiree [name] is a 53 year old female who presents for a hearing screening and follow-up on multiple health issues.  She presents for ABSS forms to be filled out and failed hearing screenign in our office today, particularly in the lower frequencies. No subjective hearing loss is noted, but there is a family history of hearing loss.  She has a history of anxiety and recurrent major  depression, with a depression score of 15 and an anxiety score of 15 noted previously. She feels nervous and anxious, she recently lost a job that she was not happy with but now transitioning to a new job and is worried about leaving her mother at home alone due to her multiple medical problems. 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 She is not currently on medication for depression or anxiety but has taken medication in the past.  She experiences migraines every other week, lasting about four days each. Symptoms include pain on the side of her head, nausea, vomiting, and sensitivity to light and smells. She has been using Tylenol prn pain   She has a history of sleep apnea diagnosed in 2019 with an AHI of 12 and uses a CPAP machine nightly. She had issues with insurance coverage for CPAP supplies due to non-compliance but has been using the machine daily for the past six weeks.  She has prediabetes with a slightly elevated blood sugar level. She is interested in monitoring her blood sugar levels , she is interested in using CGM  She has hypothyroidism and is taking Cytomel. Her last TSH was within normal limits. She  is planning to see a thyroid doctor in [place].  She has a history of vitamin D deficiency and is currently taking vitamin D supplements   . She also uses a nasal spray for nasal congestion to aid with CPAP use.  She has a history of diverticulitis but has not seen a GI doctor recently. She is aware that she needs to follow up for colonoscopy as needed.  She reports a past issue with low platelets, which has since normalized.          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   Platelet dysfunction (HCC) 04/07/2011   Lupus anticoagulant positive 11/07/2010    Past Surgical History:  Procedure Laterality Date   ANAL FISSURE REPAIR  2008   COLONOSCOPY WITH PROPOFOL N/A 07/11/2018   Procedure: COLONOSCOPY WITH PROPOFOL;  Surgeon: Toney Reil, MD;  Location: [facility] ENDOSCOPY;  Service: Gastroenterology;  Laterality: N/A;   EXCISIONAL HEMORRHOIDECTOMY  2008   Bleeding complication   HERNIA REPAIR      Family History  Problem Relation Age  of Onset   Stroke Mother        late 46s   Hypertension Mother    Lupus Cousin    Breast cancer Cousin 42   Cancer Father        Prostate   Healthy Brother    Hypertension Maternal Grandmother    Heart attack Maternal Grandmother        before age 73   Cancer Maternal Grandfather        Throat   Hypertension Paternal Grandmother    Cancer Paternal Grandmother        Jaw   Stroke Paternal Grandfather        before age 56   Hypertension Brother    Healthy Son    Breast cancer Cousin 40    Social History   Tobacco Use   Smoking  status: Never   Smokeless tobacco: Never  Substance Use Topics   Alcohol use: No     Current Outpatient Medications:    Cholecalciferol (VITAMIN D) 50 MCG (2000 UT) CAPS, Take 1 capsule by mouth daily., Disp: , Rfl:    fluticasone (FLONASE) 50 MCG/ACT nasal spray, Place 1 spray into both nostrils daily., Disp: 16 g, Rfl: 2   liothyronine (CYTOMEL) 5 MCG tablet, Take 5-10 mcg by mouth every morning., Disp: , Rfl:    Misc Natural Products (CORTISOL PO), Take 1 tablet by mouth daily., Disp: , Rfl:    methocarbamol (ROBAXIN) 500 MG tablet, Take 1 tablet (500 mg total) by mouth 2 (two) times daily as needed for muscle spasms., Disp: 20 tablet, Rfl: 0  Allergies  Allergen Reactions   Biotin Hives and Palpitations   Nylon Itching   Cheese Other (See Comments) and Rash    I personally reviewed active problem list, medication list, allergies with the patient/caregiver today.   ROS  Ten systems reviewed and is negative except as mentioned in HPI    Objective  Vitals:   11/07/23 1442  BP: 118/72  Pulse: 95  Resp: 16  Temp: (!) 97.5 F (36.4 C)  TempSrc: Oral  SpO2: 98%  Weight: 178 lb 8 oz (81 kg)  Height: 5\' 3"  (1.6 m)    Body mass index is 31.62 kg/m.  Physical Exam  Constitutional: Patient appears well-developed and well-nourished. Obese  No distress.  HEENT: head atraumatic, normocephalic, pupils equal and reactive to light, neck supple Cardiovascular: Normal rate, regular rhythm and normal heart sounds.  No murmur heard. No BLE edema. Pulmonary/Chest: Effort normal and breath sounds normal. No respiratory distress. Abdominal: Soft.  There is no tenderness. Psychiatric: Patient has a normal mood and affect. behavior is normal. Judgment and thought content normal.   Recent Results (from the past 2160 hours)  Lipid panel     Status: Abnormal   Collection Time: 08/21/23  1:51 PM  Result Value Ref Range   Cholesterol 185 <200 mg/dL   HDL 57 > OR = 50 mg/dL    Triglycerides 409 <811 mg/dL   LDL Cholesterol (Calc) 105 (H) mg/dL (calc)    Comment: Reference range: <100 . Desirable range <100 mg/dL for primary prevention;   <70 mg/dL for patients with CHD or diabetic patients  with > or = 2 CHD risk factors. Marland Kitchen LDL-C is now calculated using the Martin-Hopkins  calculation, which is a validated novel method providing  better accuracy than the Friedewald equation in the  estimation of LDL-C.  Horald Pollen et al. Lenox Ahr. 9147;829(56): 2061-2068  (http://education.[facility].com/faq/FAQ164)    Total  CHOL/HDL Ratio 3.2 <5.0 (calc)   Non-HDL Cholesterol (Calc) 128 <130 mg/dL (calc)    Comment: For patients with diabetes plus 1 major ASCVD risk  factor, treating to a non-HDL-C goal of <100 mg/dL  (LDL-C of <69 mg/dL) is considered a therapeutic  option.   CBC with Differential/Platelet     Status: Abnormal   Collection Time: 08/21/23  1:51 PM  Result Value Ref Range   WBC 5.4 3.8 - 10.8 Thousand/uL   RBC 4.56 3.80 - 5.10 Million/uL   Hemoglobin 11.7 11.7 - 15.5 g/dL   HCT 62.9 52.8 - 41.3 %   MCV 81.4 80.0 - 100.0 fL   MCH 25.7 (L) 27.0 - 33.0 pg   MCHC 31.5 (L) 32.0 - 36.0 g/dL    Comment: For adults, a slight decrease in the calculated MCHC value (in the range of 30 to 32 g/dL) is most likely not clinically significant; however, it should be interpreted with caution in correlation with other red cell parameters and the patient's clinical condition.    RDW 12.8 11.0 - 15.0 %   Platelets 352 140 - 400 Thousand/uL   MPV 10.3 7.5 - 12.5 fL   Neutro Abs 3,191 1,500 - 7,800 cells/uL   Absolute Lymphocytes 1,717 850 - 3,900 cells/uL   Absolute Monocytes 421 200 - 950 cells/uL   Eosinophils Absolute 49 15 - 500 cells/uL   Basophils Absolute 22 0 - 200 cells/uL   Neutrophils Relative % 59.1 %   Total Lymphocyte 31.8 %   Monocytes Relative 7.8 %   Eosinophils Relative 0.9 %   Basophils Relative 0.4 %  COMPLETE METABOLIC PANEL WITH GFR      Status: None   Collection Time: 08/21/23  1:51 PM  Result Value Ref Range   Glucose, Bld 79 65 - 99 mg/dL    Comment: .            Fasting reference interval .    BUN 10 7 - 25 mg/dL   Creat 2.44 0.10 - 2.72 mg/dL   eGFR 74 > OR = 60 ZD/GUY/4.03K7   BUN/Creatinine Ratio SEE NOTE: 6 - 22 (calc)    Comment:    Not Reported: BUN and Creatinine are within    reference range. .    Sodium 142 135 - 146 mmol/L   Potassium 3.8 3.5 - 5.3 mmol/L   Chloride 104 98 - 110 mmol/L   CO2 31 20 - 32 mmol/L   Calcium 9.4 8.6 - 10.4 mg/dL   Total Protein 7.1 6.1 - 8.1 g/dL   Albumin 4.3 3.6 - 5.1 g/dL   Globulin 2.8 1.9 - 3.7 g/dL (calc)   AG Ratio 1.5 1.0 - 2.5 (calc)   Total Bilirubin 0.4 0.2 - 1.2 mg/dL   Alkaline phosphatase (APISO) 77 37 - 153 U/L   AST 21 10 - 35 U/L   ALT 17 6 - 29 U/L  Hemoglobin A1c     Status: Abnormal   Collection Time: 08/21/23  1:51 PM  Result Value Ref Range   Hgb A1c MFr Bld 6.0 (H) <5.7 % of total Hgb    Comment: For someone without known diabetes, a hemoglobin  A1c value between 5.7% and 6.4% is consistent with prediabetes and should be confirmed with a  follow-up test. . For someone with known diabetes, a value <7% indicates that their diabetes is well controlled. A1c targets should be individualized based on duration of diabetes, age, comorbid conditions, and other considerations. . This  assay result is consistent with an increased risk of diabetes. . Currently, no consensus exists regarding use of hemoglobin A1c for diagnosis of diabetes for children. .    Mean Plasma Glucose 126 mg/dL   eAG (mmol/L) 7.0 mmol/L  TSH     Status: None   Collection Time: 08/21/23  1:51 PM  Result Value Ref Range   TSH 0.79 mIU/L    Comment:           Reference Range .           > or = 20 Years  0.40-4.50 .                Pregnancy Ranges           First trimester    0.26-2.66           Second trimester   0.55-2.73           Third trimester    0.43-2.91      Diabetic Foot Exam:     PHQ2/9:    11/07/2023    2:41 PM 08/21/2023    1:18 PM 08/14/2023    3:08 PM 07/03/2023   11:37 AM 08/24/2022   10:18 AM  Depression screen PHQ 2/9  Decreased Interest 2 2 3  0 2  Down, Depressed, Hopeless 2 2 2  0 2  PHQ - 2 Score 4 4 5  0 4  Altered sleeping 2 3 3  0 2  Tired, decreased energy 2 3 0 0 1  Change in appetite 2 0 0 0 1  Feeling bad or failure about yourself  2 2 2  0 3  Trouble concentrating 2 3 3  0 2  Moving slowly or fidgety/restless 0 0 0 0 1  Suicidal thoughts 0 0 0 0 0  PHQ-9 Score 14 15 13  0 14  Difficult doing work/chores Very difficult   Not difficult at all Very difficult    phq 9 is positive  Fall Risk:    11/07/2023    2:34 PM 09/21/2023   10:09 AM 08/21/2023    1:09 PM 08/14/2023    3:08 PM 07/03/2023   11:37 AM  Fall Risk   Falls in the past year? 0 0 0 0 0  Number falls in past yr: 0  0 0 0  Injury with Fall? 0  0 0 0  Risk for fall due to : No Fall Risks  No Fall Risks No Fall Risks No Fall Risks  Follow up Falls prevention discussed;Education provided;Falls evaluation completed  Falls prevention discussed Falls prevention discussed Falls prevention discussed;Education provided;Falls evaluation completed     Assessment and Plan    Hearing Loss Failed hearing test, particularly at lower frequencies. No subjective hearing loss reported by the patient. -Refer to an audiologist for a comprehensive hearing test.  Anxiety and Recurrent Major Depression High scores on depression and anxiety scales. Reports feeling nervous and anxious about work. History of major depression, currently experiencing increased stress due to caregiving for mother with Parkinson's and transitioning jobs. -Start Duloxetine 30mg  daily, with potential to increase to 60mg  daily after one week. -Start Trazodone 0.5-1 tablet at bedtime for sleep. -Schedule follow-up in six weeks to assess efficacy of treatment.  Prediabetes Slightly elevated  A1C. -Consider using Dexcom 7 for continuous glucose monitoring, understanding that insurance may not cover the cost.  Migraines Recurrent migraines, approximately 8 episodes per month, lasting up to 4 days. Associated with nausea and photophobia. -Start Nurtec as needed at onset  of migraine symptoms. -Continue current regimen of feverfew supplement.  Sleep Apnea Currently using CPAP machine, but previous non-compliance noted. -Continue CPAP use nightly. -Follow up with pulmonologist for potential adjustment of CPAP settings and ordering of supplies.  Vitamin D Deficiency Currently taking Vitamin D supplement. -Continue current regimen.  Hypothyroidism Currently taking Cytomel. -Continue current regimen. -Follow up with endocrinologist as scheduled.  History of Diverticulitis No current symptoms. -No immediate action required, follow up with GI for colonoscopy as needed.  General Health Maintenance -Continue current regimen of nasal spray for nasal congestion. -Continue current regimen of Vitamin D supplement. -Cancel follow-up appointment next week and schedule for six weeks instead.

## 2023-11-14 DIAGNOSIS — Z111 Encounter for screening for respiratory tuberculosis: Secondary | ICD-10-CM | POA: Diagnosis not present

## 2023-11-16 DIAGNOSIS — Z111 Encounter for screening for respiratory tuberculosis: Secondary | ICD-10-CM | POA: Diagnosis not present

## 2023-11-21 ENCOUNTER — Ambulatory Visit: Payer: Self-pay | Admitting: Family Medicine

## 2023-11-27 DIAGNOSIS — R200 Anesthesia of skin: Secondary | ICD-10-CM | POA: Diagnosis not present

## 2023-11-27 DIAGNOSIS — R519 Headache, unspecified: Secondary | ICD-10-CM | POA: Diagnosis not present

## 2023-11-27 DIAGNOSIS — H43393 Other vitreous opacities, bilateral: Secondary | ICD-10-CM | POA: Diagnosis not present

## 2023-11-27 DIAGNOSIS — H538 Other visual disturbances: Secondary | ICD-10-CM | POA: Diagnosis not present

## 2023-11-27 DIAGNOSIS — Z7689 Persons encountering health services in other specified circumstances: Secondary | ICD-10-CM | POA: Diagnosis not present

## 2023-11-27 DIAGNOSIS — R202 Paresthesia of skin: Secondary | ICD-10-CM | POA: Diagnosis not present

## 2023-11-27 DIAGNOSIS — H532 Diplopia: Secondary | ICD-10-CM | POA: Diagnosis not present

## 2023-12-19 ENCOUNTER — Telehealth: Payer: 59 | Admitting: Family Medicine

## 2023-12-20 ENCOUNTER — Ambulatory Visit: Payer: Self-pay

## 2023-12-20 NOTE — Telephone Encounter (Signed)
 Copied from CRM [id number]. Topic: Medical Record Request - Other >> Dec 20, 2023 11:43 AM Duncan Dull wrote: Reason for CRM: Jonny Ruiz from [employer] is requesting medical records for patient. Contact number [phone]  Spoke with Stanton Kidney at [employer], transferred to [facility]. Care at [facility] to [place]. [phone] Outreach ID: [id number]

## 2023-12-20 NOTE — Telephone Encounter (Signed)
 Office notes have been faxed.

## 2024-01-09 DIAGNOSIS — Q046 Congenital cerebral cysts: Secondary | ICD-10-CM | POA: Diagnosis not present

## 2024-01-09 DIAGNOSIS — M542 Cervicalgia: Secondary | ICD-10-CM | POA: Diagnosis not present

## 2024-01-09 DIAGNOSIS — H43393 Other vitreous opacities, bilateral: Secondary | ICD-10-CM | POA: Diagnosis not present

## 2024-01-09 DIAGNOSIS — R519 Headache, unspecified: Secondary | ICD-10-CM | POA: Diagnosis not present

## 2024-01-09 DIAGNOSIS — H538 Other visual disturbances: Secondary | ICD-10-CM | POA: Diagnosis not present

## 2024-01-10 DIAGNOSIS — S93602A Unspecified sprain of left foot, initial encounter: Secondary | ICD-10-CM | POA: Diagnosis not present

## 2024-01-11 ENCOUNTER — Other Ambulatory Visit: Payer: Self-pay | Admitting: Student

## 2024-01-11 DIAGNOSIS — Q046 Congenital cerebral cysts: Secondary | ICD-10-CM

## 2024-01-15 ENCOUNTER — Encounter: Payer: Self-pay | Admitting: Student

## 2024-01-16 ENCOUNTER — Ambulatory Visit
Admission: RE | Admit: 2024-01-16 | Discharge: 2024-01-16 | Disposition: A | Discharge status: Home / Self Care | Source: Ambulatory Visit | Attending: Family Medicine | Admitting: Family Medicine

## 2024-01-16 ENCOUNTER — Inpatient Hospital Stay: Admission: RE | Admit: 2024-01-16 | Source: Ambulatory Visit

## 2024-01-16 DIAGNOSIS — Z1231 Encounter for screening mammogram for malignant neoplasm of breast: Secondary | ICD-10-CM

## 2024-01-19 ENCOUNTER — Ambulatory Visit
Admission: RE | Admit: 2024-01-19 | Discharge: 2024-01-19 | Disposition: A | Discharge status: Home / Self Care | Source: Ambulatory Visit | Attending: Student

## 2024-01-19 DIAGNOSIS — Q046 Congenital cerebral cysts: Secondary | ICD-10-CM

## 2024-01-30 DIAGNOSIS — M6281 Muscle weakness (generalized): Secondary | ICD-10-CM | POA: Diagnosis not present

## 2024-01-30 DIAGNOSIS — M542 Cervicalgia: Secondary | ICD-10-CM | POA: Diagnosis not present

## 2024-02-13 DIAGNOSIS — M6281 Muscle weakness (generalized): Secondary | ICD-10-CM | POA: Diagnosis not present

## 2024-02-13 DIAGNOSIS — M542 Cervicalgia: Secondary | ICD-10-CM | POA: Diagnosis not present

## 2024-02-27 DIAGNOSIS — M542 Cervicalgia: Secondary | ICD-10-CM | POA: Diagnosis not present

## 2024-02-27 DIAGNOSIS — M6281 Muscle weakness (generalized): Secondary | ICD-10-CM | POA: Diagnosis not present

## 2024-08-08 ENCOUNTER — Ambulatory Visit: Payer: Self-pay

## 2024-08-08 ENCOUNTER — Ambulatory Visit: Admitting: Family Medicine

## 2024-08-08 NOTE — Telephone Encounter (Signed)
 FYI Only or Action Required?: FYI only for provider: UC today.  Patient was last seen in primary care on 11/07/2023 by Glenard Mire, MD.  Called Nurse Triage reporting Abdominal Pain.  Symptoms began several days ago.  Interventions attempted: Nothing.  Symptoms are: stable.  Triage Disposition: See HCP Within 4 Hours (Or PCP Triage)  Patient/caregiver understands and will follow disposition?: Yes   Copied from CRM [id number]. Topic: Clinical - Red Word Triage >> Aug 08, 2024  9:41 AM Desiree [name] wrote: Red Word that prompted transfer to Nurse Triage: She is wanting to r/s appt for today with sharp pains and possible diverticulitis/cold sweats. Reason for Disposition  [1] MILD-MODERATE pain AND [2] constant AND [3] present > 2 hours  Answer Assessment - Initial Assessment Questions Patient says that she will need to cancel her appointment today due to having no insurance at this time and unable to afford the payment today. She says she thought she had selected health insurance at her job, but didn't. She will call back once it's active. Advised to go to the UC for the abdominal pain, she agreed.   1. LOCATION: Where does it hurt?      Left side above hip  2. RADIATION: Does the pain shoot anywhere else? (e.g., chest, back)     No  3. ONSET: When did the pain begin? (e.g., minutes, hours or days ago)      Wednesday night  4. SUDDEN: Gradual or sudden onset?     Sudden  5. PATTERN Does the pain come and go, or is it constant?     Comes and goes  6. SEVERITY: How bad is the pain?  (e.g., Scale 1-10; mild, moderate, or severe)     2 at this time, worst pain up to 8  7. RECURRENT SYMPTOM: Have you ever had this type of stomach pain before? If Yes, ask: When was the last time? and What happened that time?      Yes, h/o diverticulitis  8. CAUSE: What do you think is causing the stomach pain? (e.g., gallstones, recent abdominal surgery)     Possibly  diverticulitis flare up  9. RELIEVING/AGGRAVATING FACTORS: What makes it better or worse? (e.g., antacids, bending or twisting motion, bowel movement)     Better if don't eat; worse drinking sodas or eating spicy foods  10. OTHER SYMPTOMS: Do you have any other symptoms? (e.g., back pain, diarrhea, fever, urination pain, vomiting)       Right hip hurts, cold sweats  11. PREGNANCY: Is there any chance you are pregnant? When was your last menstrual period?       No  Protocols used: Abdominal Pain - Female-A-AH
